# Patient Record
Sex: Female | Born: 1995 | Hispanic: Yes | Marital: Married | Smoking: Never smoker
Health system: Southern US, Community
[De-identification: ages and names within clinical notes are randomized; demographics above are authoritative.]

## PROBLEM LIST (undated history)

## (undated) DIAGNOSIS — O139 Gestational [pregnancy-induced] hypertension without significant proteinuria, unspecified trimester: Secondary | ICD-10-CM

## (undated) DIAGNOSIS — F32A Depression, unspecified: Secondary | ICD-10-CM

## (undated) HISTORY — PX: NO PAST SURGERIES: SHX2092

---

## 2020-04-11 ENCOUNTER — Ambulatory Visit: Payer: Self-pay | Admitting: Skilled Nursing Facility1

## 2021-07-11 LAB — OB RESULTS CONSOLE ABO/RH: RH Type: POSITIVE

## 2021-07-11 LAB — OB RESULTS CONSOLE HIV ANTIBODY (ROUTINE TESTING): HIV: NONREACTIVE

## 2021-07-11 LAB — HEPATITIS C ANTIBODY: HCV Ab: NEGATIVE

## 2021-07-11 LAB — OB RESULTS CONSOLE HEPATITIS B SURFACE ANTIGEN: Hepatitis B Surface Ag: NEGATIVE

## 2021-07-11 LAB — OB RESULTS CONSOLE RPR: RPR: NONREACTIVE

## 2021-07-11 LAB — OB RESULTS CONSOLE ANTIBODY SCREEN: Antibody Screen: NEGATIVE

## 2021-07-11 LAB — OB RESULTS CONSOLE RUBELLA ANTIBODY, IGM: Rubella: IMMUNE

## 2021-07-26 LAB — OB RESULTS CONSOLE GC/CHLAMYDIA
Chlamydia: NEGATIVE
Neisseria Gonorrhea: NEGATIVE

## 2021-12-16 ENCOUNTER — Encounter (HOSPITAL_COMMUNITY): Payer: Self-pay | Admitting: Obstetrics and Gynecology

## 2021-12-16 ENCOUNTER — Inpatient Hospital Stay (HOSPITAL_COMMUNITY)
Admission: AD | Admit: 2021-12-16 | Discharge: 2021-12-17 | Disposition: A | Discharge status: Home / Self Care | Payer: Self-pay | Attending: Obstetrics and Gynecology | Admitting: Obstetrics and Gynecology

## 2021-12-16 ENCOUNTER — Other Ambulatory Visit: Payer: Self-pay

## 2021-12-16 DIAGNOSIS — O4693 Antepartum hemorrhage, unspecified, third trimester: Secondary | ICD-10-CM

## 2021-12-16 DIAGNOSIS — R109 Unspecified abdominal pain: Secondary | ICD-10-CM | POA: Insufficient documentation

## 2021-12-16 DIAGNOSIS — O26853 Spotting complicating pregnancy, third trimester: Secondary | ICD-10-CM | POA: Insufficient documentation

## 2021-12-16 DIAGNOSIS — Z3A30 30 weeks gestation of pregnancy: Secondary | ICD-10-CM | POA: Insufficient documentation

## 2021-12-16 DIAGNOSIS — O26893 Other specified pregnancy related conditions, third trimester: Secondary | ICD-10-CM | POA: Insufficient documentation

## 2021-12-16 HISTORY — DX: Depression, unspecified: F32.A

## 2021-12-16 LAB — URINALYSIS, ROUTINE W REFLEX MICROSCOPIC
Bilirubin Urine: NEGATIVE
Glucose, UA: 50 mg/dL — AB
Ketones, ur: NEGATIVE mg/dL
Leukocytes,Ua: NEGATIVE
Nitrite: NEGATIVE
Protein, ur: NEGATIVE mg/dL
Specific Gravity, Urine: 1.009 (ref 1.005–1.030)
pH: 7 (ref 5.0–8.0)

## 2021-12-16 NOTE — MAU Note (Incomplete)
.  Joan Foster is a 26 y.o. at [redacted]w[redacted]d here in MAU reporting: spotting that started earlier today and noticed cramping the same time she noticed the spotting. Over the course of the day the spotting has gotten heavier and the cramping has worsened some. Reports positive fetal movement. Last intercourse was weeks ago. ?Onset of complaint: this am ?Pain score: 3/10 ?Vitals:  ? 12/16/21 2214  ?BP: 117/75  ?Pulse: (!) 113  ?Resp: 15  ?Temp: 97.8 ?F (36.6 ?C)  ?SpO2: 100%  ?   ?Lab orders placed from triage:  urine ? ?

## 2021-12-16 NOTE — MAU Provider Note (Signed)
Patient Joan Foster is a 26 y.o. G1P0 ? At [redacted]w[redacted]d here with complaints of light cramping and spotting today. She reports active fetal movements, she denies LOF. She reports that she is RH positive; she did not need Rhogam at 28 weeks.  ? ?She denies recent IC or anything in vagina in past 24 hours. She denies any syncope, chest pain, fever, decreased appetite, NV, contractions. She reports some small cramps.  ?History  ?  ? ?CSN: [id number] ? ?Arrival date and time: 12/16/21 2151 ? ? Event Date/Time  ? First Provider Initiated Contact with Patient 12/16/21 2350   ?  ? ?Chief Complaint  ?Patient presents with  ? Abdominal Pain  ? ?Abdominal Pain ?This is a new problem. The current episode started today. The onset quality is sudden. Pain scale: 0 at rest but 8 when walking. Pertinent negatives include no constipation, diarrhea, dysuria or fever.  ?Vaginal Bleeding ?The patient's primary symptoms include vaginal bleeding. This is a new problem. The current episode started today. The problem has been gradually worsening. Associated symptoms include abdominal pain. Pertinent negatives include no constipation, diarrhea, dysuria or fever. The vaginal discharge was bloody. The vaginal bleeding is lighter than menses. She has not been passing clots. She has not been passing tissue.  ?She reports that she had streaks on her pantyliners and changed them frequnely but all in all she probably only had one full panty-liner.  ?OB History   ? ? Gravida  ?1  ? Para  ?   ? Term  ?   ? Preterm  ?   ? AB  ?   ? Living  ?   ?  ? ? SAB  ?   ? IAB  ?   ? Ectopic  ?   ? Multiple  ?   ? Live Births  ?   ?   ?  ?  ? ? ?Past Medical History:  ?Diagnosis Date  ? Depression   ? ? ?Past Surgical History:  ?Procedure Laterality Date  ? NO PAST SURGERIES    ? ? ?History reviewed. No pertinent family history. ? ?Social History  ? ?Tobacco Use  ? Smoking status: Never  ? Smokeless tobacco: Never  ?Vaping Use  ? Vaping Use: Never used   ?Substance Use Topics  ? Alcohol use: Never  ? Drug use: Never  ? ? ?Allergies: Not on File ? ?Medications Prior to Admission  ?Medication Sig Dispense Refill Last Dose  ? buPROPion (WELLBUTRIN) 100 MG tablet Take 100 mg by mouth once.   12/15/2021  ? cholecalciferol (VITAMIN D3) 25 MCG (1000 UNIT) tablet Take 1,000 Units by mouth daily.   12/16/2021  ? Prenatal Vit-Fe Fumarate-FA (MULTIVITAMIN-PRENATAL) 27-0.8 MG TABS tablet Take 1 tablet by mouth daily at 12 noon.   12/16/2021  ? ? ?Review of Systems  ?Constitutional:  Negative for fever.  ?Gastrointestinal:  Positive for abdominal pain. Negative for constipation and diarrhea.  ?Genitourinary:  Positive for vaginal bleeding. Negative for dysuria.  ?Neurological: Negative.   ?Hematological: Negative.   ?Psychiatric/Behavioral: Negative.    ?Physical Exam  ? ?Blood pressure 117/75, pulse (!) 113, temperature 97.8 ?F (36.6 ?C), temperature source Oral, resp. rate 15, height 5\' 4"  (1.626 m), weight 80.7 kg, SpO2 100 %. ? ?Physical Exam ?Constitutional:   ?   Appearance: She is well-developed.  ?Abdominal:  ?   General: Abdomen is flat.  ?   Palpations: Abdomen is soft.  ?Genitourinary: ?   Vagina: Normal.  ?  Cervix: Normal.  ?   Comments: NEFG, trace amount of dark red blood in the vagina, blood mixed with mucous in the vaginal vault ?Neurological:  ?   Mental Status: She is alert.  ?Cervix is long, closed, posterior.  ? ?MAU Course  ?Procedures ? ?MDM ?-blood type not collected  ?-patient had limited US to assess placental location; no previa or abruption was identified ?-NST: 140 bpm, mod var, present acel, no decels, no contractions ?-Patient bleeding had resolved while in MAU ? ?Assessment and Plan  ? ?1. Vaginal bleeding in pregnancy, third trimester   ?2. [redacted] weeks gestation of pregnancy   ?-patient stable for discharge; she was given strict return precautions and bleeding precautions ?-confirmed again that patient states she is not negative blood type ?-patient and  partner verbalized understanding; all questions answered.  ?-explained to patient that if she has another episode of bleeding, she will most likely be admitted for 7 days for observation. Patient and husband understand this and will return if any new bleeding.  ?-keep follow up at [facility] ?Charlesetta Garibaldi Coreyon Nicotra ?12/16/2021, 11:57 PM  ?

## 2021-12-17 ENCOUNTER — Inpatient Hospital Stay (HOSPITAL_BASED_OUTPATIENT_CLINIC_OR_DEPARTMENT_OTHER): Payer: Self-pay

## 2021-12-17 DIAGNOSIS — O26893 Other specified pregnancy related conditions, third trimester: Secondary | ICD-10-CM

## 2021-12-17 DIAGNOSIS — O26853 Spotting complicating pregnancy, third trimester: Secondary | ICD-10-CM

## 2021-12-17 DIAGNOSIS — Z3A30 30 weeks gestation of pregnancy: Secondary | ICD-10-CM

## 2021-12-17 DIAGNOSIS — O4693 Antepartum hemorrhage, unspecified, third trimester: Secondary | ICD-10-CM

## 2021-12-17 LAB — WET PREP, GENITAL
Clue Cells Wet Prep HPF POC: NONE SEEN
Sperm: NONE SEEN
Trich, Wet Prep: NONE SEEN
WBC, Wet Prep HPF POC: >=10 — AB (ref <10)
Yeast Wet Prep HPF POC: NONE SEEN

## 2021-12-18 LAB — GC/CHLAMYDIA PROBE AMP (~~LOC~~) NOT AT ARMC
Chlamydia: NEGATIVE
Comment: NEGATIVE
Comment: NORMAL
Neisseria Gonorrhea: NEGATIVE

## 2022-02-01 ENCOUNTER — Telehealth (HOSPITAL_COMMUNITY): Payer: Self-pay | Admitting: *Deleted

## 2022-02-01 LAB — OB RESULTS CONSOLE GBS: GBS: POSITIVE

## 2022-02-01 NOTE — Telephone Encounter (Signed)
Preadmission screen  

## 2022-02-02 ENCOUNTER — Telehealth (HOSPITAL_COMMUNITY): Payer: Self-pay | Admitting: *Deleted

## 2022-02-05 ENCOUNTER — Telehealth (HOSPITAL_COMMUNITY): Payer: Self-pay | Admitting: *Deleted

## 2022-02-06 ENCOUNTER — Telehealth (HOSPITAL_COMMUNITY): Payer: Self-pay | Admitting: *Deleted

## 2022-02-07 ENCOUNTER — Telehealth (HOSPITAL_COMMUNITY): Payer: Self-pay | Admitting: *Deleted

## 2022-02-07 NOTE — Telephone Encounter (Signed)
Preadmission screen  

## 2022-02-08 ENCOUNTER — Telehealth (HOSPITAL_COMMUNITY): Payer: Self-pay | Admitting: *Deleted

## 2022-02-08 NOTE — Telephone Encounter (Signed)
Preadmission screen  

## 2022-02-21 ENCOUNTER — Inpatient Hospital Stay (HOSPITAL_COMMUNITY): Payer: Self-pay

## 2022-02-21 ENCOUNTER — Inpatient Hospital Stay (HOSPITAL_COMMUNITY): Payer: Self-pay | Admitting: Anesthesiology

## 2022-02-21 ENCOUNTER — Encounter (HOSPITAL_COMMUNITY): Payer: Self-pay | Admitting: Obstetrics & Gynecology

## 2022-02-21 ENCOUNTER — Inpatient Hospital Stay (HOSPITAL_COMMUNITY)
Admission: AD | Admit: 2022-02-21 | Discharge: 2022-02-24 | DRG: 788 | Disposition: A | Discharge status: Home / Self Care | Payer: Self-pay | Attending: Obstetrics & Gynecology | Admitting: Obstetrics & Gynecology

## 2022-02-21 DIAGNOSIS — Z3490 Encounter for supervision of normal pregnancy, unspecified, unspecified trimester: Principal | ICD-10-CM

## 2022-02-21 DIAGNOSIS — Z3A40 40 weeks gestation of pregnancy: Secondary | ICD-10-CM

## 2022-02-21 DIAGNOSIS — F32A Depression, unspecified: Secondary | ICD-10-CM | POA: Diagnosis present

## 2022-02-21 DIAGNOSIS — Z98891 History of uterine scar from previous surgery: Secondary | ICD-10-CM

## 2022-02-21 DIAGNOSIS — O99214 Obesity complicating childbirth: Secondary | ICD-10-CM | POA: Diagnosis present

## 2022-02-21 DIAGNOSIS — O3663X0 Maternal care for excessive fetal growth, third trimester, not applicable or unspecified: Principal | ICD-10-CM | POA: Diagnosis present

## 2022-02-21 DIAGNOSIS — O99344 Other mental disorders complicating childbirth: Secondary | ICD-10-CM | POA: Diagnosis present

## 2022-02-21 DIAGNOSIS — O99824 Streptococcus B carrier state complicating childbirth: Secondary | ICD-10-CM | POA: Diagnosis present

## 2022-02-21 LAB — CBC
HCT: 33.7 % — ABNORMAL LOW (ref 36.0–46.0)
Hemoglobin: 11.2 g/dL — ABNORMAL LOW (ref 12.0–15.0)
MCH: 27.8 pg (ref 26.0–34.0)
MCHC: 33.2 g/dL (ref 30.0–36.0)
MCV: 83.6 fL (ref 80.0–100.0)
Platelets: 284 10*3/uL (ref 150–400)
RBC: 4.03 MIL/uL (ref 3.87–5.11)
RDW: 13.3 % (ref 11.5–15.5)
WBC: 7.9 10*3/uL (ref 4.0–10.5)
nRBC: 0 % (ref 0.0–0.2)

## 2022-02-21 LAB — TYPE AND SCREEN
ABO/RH(D): O POS
Antibody Screen: NEGATIVE

## 2022-02-21 LAB — RPR: RPR Ser Ql: NONREACTIVE

## 2022-02-21 MED ORDER — FENTANYL-BUPIVACAINE-NACL 0.5-0.125-0.9 MG/250ML-% EP SOLN
12.0000 mL/h | EPIDURAL | Status: DC | PRN
  Administered 2022-02-21: 12 mL/h via EPIDURAL
  Filled 2022-02-21: qty 250

## 2022-02-21 MED ORDER — OXYTOCIN-SODIUM CHLORIDE 30-0.9 UT/500ML-% IV SOLN
1.0000 m[IU]/min | INTRAVENOUS | Status: DC
  Administered 2022-02-21: 2 m[IU]/min via INTRAVENOUS

## 2022-02-21 MED ORDER — LIDOCAINE HCL (PF) 1 % IJ SOLN: 30.0000 mL | INTRAMUSCULAR | Status: DC | PRN

## 2022-02-21 MED ORDER — SOD CITRATE-CITRIC ACID 500-334 MG/5ML PO SOLN
30.0000 mL | ORAL | Status: DC | PRN
  Administered 2022-02-22: 30 mL via ORAL
  Filled 2022-02-21: qty 30

## 2022-02-21 MED ORDER — LACTATED RINGERS IV SOLN: 500.0000 mL | INTRAVENOUS | Status: DC | PRN

## 2022-02-21 MED ORDER — TERBUTALINE SULFATE 1 MG/ML IJ SOLN: 0.2500 mg | Freq: Once | INTRAMUSCULAR | Status: DC | PRN

## 2022-02-21 MED ORDER — EPHEDRINE 5 MG/ML INJ: 10.0000 mg | INTRAVENOUS | Status: DC | PRN

## 2022-02-21 MED ORDER — ONDANSETRON HCL 4 MG/2ML IJ SOLN: 4.0000 mg | Freq: Four times a day (QID) | INTRAMUSCULAR | Status: DC | PRN

## 2022-02-21 MED ORDER — SODIUM CHLORIDE 0.9 % IV SOLN
5.0000 10*6.[IU] | Freq: Once | INTRAVENOUS | Status: AC
  Administered 2022-02-21: 5 10*6.[IU] via INTRAVENOUS
  Filled 2022-02-21: qty 5

## 2022-02-21 MED ORDER — OXYTOCIN BOLUS FROM INFUSION: 333.0000 mL | Freq: Once | INTRAVENOUS | Status: DC

## 2022-02-21 MED ORDER — OXYTOCIN-SODIUM CHLORIDE 30-0.9 UT/500ML-% IV SOLN
2.5000 [IU]/h | INTRAVENOUS | Status: DC
  Filled 2022-02-21: qty 500

## 2022-02-21 MED ORDER — LACTATED RINGERS IV SOLN: 500.0000 mL | Freq: Once | INTRAVENOUS | Status: DC

## 2022-02-21 MED ORDER — LIDOCAINE HCL (PF) 1 % IJ SOLN
INTRAMUSCULAR | Status: DC | PRN
  Administered 2022-02-21: 10 mL via EPIDURAL

## 2022-02-21 MED ORDER — MISOPROSTOL 25 MCG QUARTER TABLET
25.0000 ug | ORAL_TABLET | ORAL | Status: DC | PRN
Start: 2022-02-21 — End: 2022-02-22
  Administered 2022-02-21 (×2): 25 ug via VAGINAL
  Filled 2022-02-21 (×2): qty 1

## 2022-02-21 MED ORDER — OXYCODONE-ACETAMINOPHEN 5-325 MG PO TABS: 1.0000 | ORAL_TABLET | ORAL | Status: DC | PRN

## 2022-02-21 MED ORDER — PHENYLEPHRINE 80 MCG/ML (10ML) SYRINGE FOR IV PUSH (FOR BLOOD PRESSURE SUPPORT): 80.0000 ug | PREFILLED_SYRINGE | INTRAVENOUS | Status: DC | PRN

## 2022-02-21 MED ORDER — OXYCODONE-ACETAMINOPHEN 5-325 MG PO TABS: 2.0000 | ORAL_TABLET | ORAL | Status: DC | PRN

## 2022-02-21 MED ORDER — PENICILLIN G POT IN DEXTROSE 60000 UNIT/ML IV SOLN
3.0000 10*6.[IU] | INTRAVENOUS | Status: DC
  Administered 2022-02-21 (×5): 3 10*6.[IU] via INTRAVENOUS
  Filled 2022-02-21 (×5): qty 50

## 2022-02-21 MED ORDER — FENTANYL CITRATE (PF) 100 MCG/2ML IJ SOLN: 50.0000 ug | INTRAMUSCULAR | Status: DC | PRN

## 2022-02-21 MED ORDER — ZOLPIDEM TARTRATE 5 MG PO TABS
5.0000 mg | ORAL_TABLET | Freq: Every evening | ORAL | Status: DC | PRN
Start: 2022-02-21 — End: 2022-02-22

## 2022-02-21 MED ORDER — ACETAMINOPHEN 325 MG PO TABS
650.0000 mg | ORAL_TABLET | ORAL | Status: DC | PRN
  Administered 2022-02-21 (×2): 650 mg via ORAL
  Filled 2022-02-21 (×2): qty 2

## 2022-02-21 MED ORDER — DIPHENHYDRAMINE HCL 50 MG/ML IJ SOLN: 12.5000 mg | INTRAMUSCULAR | Status: DC | PRN

## 2022-02-21 MED ORDER — LACTATED RINGERS IV SOLN: INTRAVENOUS | Status: DC

## 2022-02-21 NOTE — Anesthesia Preprocedure Evaluation (Addendum)
Anesthesia Evaluation  Patient identified by MRN, date of birth, ID band Patient awake    Reviewed: Allergy & Precautions, Patient's Chart, lab work & pertinent test results  Airway Mallampati: II  TM Distance: >3 FB Neck ROM: Full    Dental no notable dental hx.    Pulmonary neg pulmonary ROS,    Pulmonary exam normal breath sounds clear to auscultation       Cardiovascular negative cardio ROS Normal cardiovascular exam Rhythm:Regular Rate:Normal     Neuro/Psych PSYCHIATRIC DISORDERS Depression negative neurological ROS     GI/Hepatic Neg liver ROS, GERD  ,  Endo/Other  Obesity  Renal/GU negative Renal ROS  negative genitourinary   Musculoskeletal negative musculoskeletal ROS (+)   Abdominal (+) + obese,   Peds  Hematology  (+) Blood dyscrasia, anemia ,   Anesthesia Other Findings   Reproductive/Obstetrics (+) Pregnancy                             Anesthesia Physical Anesthesia Plan  ASA: 2  Anesthesia Plan: Epidural   Post-op Pain Management:    Induction:   PONV Risk Score and Plan:   Airway Management Planned: Natural Airway  Additional Equipment:   Intra-op Plan:   Post-operative Plan:   Informed Consent: I have reviewed the patients History and Physical, chart, labs and discussed the procedure including the risks, benefits and alternatives for the proposed anesthesia with the patient or authorized representative who has indicated his/her understanding and acceptance.       Plan Discussed with: Anesthesiologist  Anesthesia Plan Comments:         Anesthesia Quick Evaluation

## 2022-02-21 NOTE — H&P (Signed)
Joan Foster is a 26 y.o. female G1 at [redacted]w[redacted]d presenting for eIOL at term.  Patient received second dose of cytotec at 0458.  She is feeling mild, infrequent CTX.  Active FM.  Antepartum course complicated by depression; taking Wellbutrin.  Patient has h/o LEEP.  GBS positive.   OB History     Gravida  1   Para      Term      Preterm      AB      Living         SAB      IAB      Ectopic      Multiple      Live Births             Past Medical History:  Diagnosis Date   Depression    Past Surgical History:  Procedure Laterality Date   NO PAST SURGERIES     Family History: family history is not on file. Social History:  reports that she has never smoked. She has never used smokeless tobacco. She reports that she does not drink alcohol and does not use drugs.     Maternal Diabetes: No Genetic Screening: Normal Maternal Ultrasounds/Referrals: Normal Fetal Ultrasounds or other Referrals:  None Maternal Substance Abuse:  No Significant Maternal Medications:  Meds include: Other: Wellbutrin Significant Maternal Lab Results:  Group B Strep positive Other Comments:  None  Review of Systems Maternal Medical History:  Fetal activity: Perceived fetal activity is normal.   Last perceived fetal movement was within the past hour.   Prenatal complications: no prenatal complications Prenatal Complications - Diabetes: none.   Dilation: 1.5 Effacement (%): 50 Station: Ballotable Exam by:: [employer]  RN Blood pressure 119/76, pulse 89, temperature 98 F (36.7 C), temperature source Oral, resp. rate 17, height 5\' 4"  (1.626 m), weight 88.4 kg. Maternal Exam:  Uterine Assessment: Contraction strength is mild.  Contraction frequency is irregular.  Abdomen: Patient reports no abdominal tenderness. Fundal height is c/w dates.   Estimated fetal weight is 8#.     Fetal Exam Fetal Monitor Review: Baseline rate: 130.  Variability: moderate (6-25 bpm).    Pattern: accelerations present and no decelerations.   Fetal State Assessment: Category I - tracings are normal.   Physical Exam Constitutional:      Appearance: Normal appearance.  HENT:     Head: Normocephalic and atraumatic.  Pulmonary:     Effort: Pulmonary effort is normal.  Abdominal:     Palpations: Abdomen is soft.  Musculoskeletal:        General: Normal range of motion.     Cervical back: Normal range of motion.  Skin:    General: Skin is warm and dry.  Neurological:     Mental Status: She is alert and oriented to person, place, and time.  Psychiatric:        Mood and Affect: Mood normal.        Behavior: Behavior normal.     Prenatal labs: ABO, Rh: --/--/O POS (07/12 0046) Antibody: NEG (07/12 0046) Rubella: Immune (11/29 0000) RPR: NON REACTIVE (07/12 0046)  HBsAg: Negative (11/29 0000)  HIV: Non-reactive (11/29 0000)  GBS: Positive/-- (06/22 0000)   Assessment/Plan: 26yo G1 at [redacted]w[redacted]d for eIOL -Recheck cvx around 0900 -CLEA if desired -PCN for GBS ppx -Anticipate NSVD   [redacted]w[redacted]d 02/21/2022, 8:24 AM

## 2022-02-21 NOTE — Progress Notes (Signed)
SVE 2-3/75/-2 Will start pitocin 2/2 AROM when in good CTX pattern FHT Cat I  Mitchel Honour, DO

## 2022-02-21 NOTE — Progress Notes (Signed)
Joan Foster is a 26 y.o. G1P0 at [redacted]w[redacted]d by ultrasound admitted for induction of labor due to Elective at term.  Subjective: Comfortable with CLEA.  Objective: BP 114/83   Pulse 96   Temp 98.2 F (36.8 C) (Oral)   Resp 16   Ht 5\' 4"  (1.626 m)   Wt 88.4 kg   SpO2 98%   BMI 33.45 kg/m  I/O last 3 completed shifts: In: -  Out: 250 [Urine:250] No intake/output data recorded.  FHT:  FHR: 140 bpm, variability: moderate,  accelerations:  Present,  decelerations:  Absent UC:   regular, every 2 minutes SVE:   Dilation: 5 Effacement (%): 90 Station: -2, -1 Exam by:: Dr. [email or web address] IUPC placed  Labs: Lab Results  Component Value Date   WBC 7.9 02/21/2022   HGB 11.2 (L) 02/21/2022   HCT 33.7 (L) 02/21/2022   MCV 83.6 02/21/2022   PLT 284 02/21/2022    Assessment / Plan: Induction of labor due to elective at term,  progressing well on pitocin  Labor:  IUPC placed to better monitor CTX and MVUs; continue pitocin Preeclampsia:   n/a Fetal Wellbeing:  Category I Pain Control:  Epidural I/D:  n/a Anticipated MOD:  NSVD  04/24/2022, DO 02/21/2022, 8:10 PM

## 2022-02-21 NOTE — Progress Notes (Signed)
Marnae Lorella Nimrod is a 26 y.o. G1P0 at [redacted]w[redacted]d by ultrasound admitted for induction of labor due to Elective at term.  Subjective: No c/o.  Comfortable with CLEA  Objective: BP 117/73   Pulse 79   Temp 97.7 F (36.5 C) (Oral)   Resp 17   Ht 5\' 4"  (1.626 m)   Wt 88.4 kg   SpO2 98%   BMI 33.45 kg/m  No intake/output data recorded. No intake/output data recorded.  FHT:  FHR: 130 bpm, variability: moderate,  accelerations:  Present,  decelerations:  Absent UC:   regular, every 2-3 minutes SVE:   4/75/-2  Labs: Lab Results  Component Value Date   WBC 7.9 02/21/2022   HGB 11.2 (L) 02/21/2022   HCT 33.7 (L) 02/21/2022   MCV 83.6 02/21/2022   PLT 284 02/21/2022    Assessment / Plan: Induction of labor due to elective at term,  progressing well on pitocin  Labor: Progressing normally Preeclampsia:   n/a; elevated BPs resolved after CLEA Fetal Wellbeing:  Category I Pain Control:  Epidural I/D:  n/a Anticipated MOD:  NSVD  04/24/2022, DO 02/21/2022, 4:39 PM

## 2022-02-21 NOTE — Anesthesia Procedure Notes (Addendum)
Epidural Patient location during procedure: OB Start time: 02/21/2022 2:55 PM End time: 02/21/2022 3:02 PM  Staffing Anesthesiologist: Mal Amabile, MD Performed: anesthesiologist   Preanesthetic Checklist Completed: patient identified, IV checked, site marked, risks and benefits discussed, surgical consent, monitors and equipment checked, pre-op evaluation and timeout performed  Epidural Patient position: sitting Prep: DuraPrep and site prepped and draped Patient monitoring: continuous pulse ox and blood pressure Approach: midline Location: L3-L4 Injection technique: LOR air  Needle:  Needle type: Tuohy  Needle gauge: 17 G Needle length: 9 cm and 9 Needle insertion depth: 4 cm Catheter type: closed end flexible Catheter size: 19 Gauge Catheter at skin depth: 9 cm Test dose: negative and Other  Assessment Events: blood not aspirated, injection not painful, no injection resistance, no paresthesia and negative IV test  Additional Notes Reason for block:procedure for pain

## 2022-02-21 NOTE — Progress Notes (Signed)
Joan Foster is a 26 y.o. G1P0 at 105w0d by ultrasound admitted for induction of labor due to Elective at term.  Subjective: Feeling mild CTX.  Reports HA.  No CP/SOB, RUQ pain, CP/SOB.  Objective: BP (!) 129/94   Pulse 92   Temp 97.9 F (36.6 C) (Oral)   Resp 17   Ht 5\' 4"  (1.626 m)   Wt 88.4 kg   BMI 33.45 kg/m  No intake/output data recorded. No intake/output data recorded.  FHT:  FHR: 140 bpm, variability: moderate,  accelerations:  Present,  decelerations:  Absent UC:   regular, every 2 minutes SVE:   Dilation: 2.5 Effacement (%): 70 Station: -2 Exam by:: Dr [email or web address] AROM, moderate meconium stained fluid  Labs: Lab Results  Component Value Date   WBC 7.9 02/21/2022   HGB 11.2 (L) 02/21/2022   HCT 33.7 (L) 02/21/2022   MCV 83.6 02/21/2022   PLT 284 02/21/2022    Assessment / Plan: Induction of labor due to elective at term,  progressing well on pitocin  Labor: Progressing normally Preeclampsia:   n/a.  Patient has had mild range BPs and now mild HA.  Tylenol given and will closely monitor. Fetal Wellbeing:  Category I Pain Control:  Labor support without medications I/D:  n/a Anticipated MOD:  NSVD  04/24/2022, DO 02/21/2022, 12:45 PM

## 2022-02-22 ENCOUNTER — Encounter (HOSPITAL_COMMUNITY): Payer: Self-pay | Admitting: Obstetrics & Gynecology

## 2022-02-22 ENCOUNTER — Encounter (HOSPITAL_COMMUNITY)
Admission: AD | Disposition: A | Discharge status: Home / Self Care | Payer: Self-pay | Source: Home / Self Care | Attending: Obstetrics & Gynecology

## 2022-02-22 DIAGNOSIS — Z3A40 40 weeks gestation of pregnancy: Secondary | ICD-10-CM

## 2022-02-22 DIAGNOSIS — Z98891 History of uterine scar from previous surgery: Secondary | ICD-10-CM

## 2022-02-22 LAB — CBC
HCT: 30.3 % — ABNORMAL LOW (ref 36.0–46.0)
Hemoglobin: 10 g/dL — ABNORMAL LOW (ref 12.0–15.0)
MCH: 27.8 pg (ref 26.0–34.0)
MCHC: 33 g/dL (ref 30.0–36.0)
MCV: 84.2 fL (ref 80.0–100.0)
Platelets: 222 10*3/uL (ref 150–400)
RBC: 3.6 MIL/uL — ABNORMAL LOW (ref 3.87–5.11)
RDW: 13.4 % (ref 11.5–15.5)
WBC: 14.7 10*3/uL — ABNORMAL HIGH (ref 4.0–10.5)
nRBC: 0 % (ref 0.0–0.2)

## 2022-02-22 SURGERY — Surgical Case
Anesthesia: Epidural

## 2022-02-22 MED ORDER — FERROUS SULFATE 325 (65 FE) MG PO TABS
325.0000 mg | ORAL_TABLET | Freq: Two times a day (BID) | ORAL | Status: DC
  Administered 2022-02-22 – 2022-02-24 (×5): 325 mg via ORAL
  Filled 2022-02-22 (×4): qty 1

## 2022-02-22 MED ORDER — PRENATAL MULTIVITAMIN CH
1.0000 | ORAL_TABLET | Freq: Every day | ORAL | Status: DC
  Administered 2022-02-23 – 2022-02-24 (×2): 1 via ORAL
  Filled 2022-02-22 (×2): qty 1

## 2022-02-22 MED ORDER — ONDANSETRON HCL 4 MG/2ML IJ SOLN: 4.0000 mg | Freq: Three times a day (TID) | INTRAMUSCULAR | Status: DC | PRN

## 2022-02-22 MED ORDER — FENTANYL CITRATE (PF) 100 MCG/2ML IJ SOLN
INTRAMUSCULAR | Status: AC
  Filled 2022-02-22: qty 2

## 2022-02-22 MED ORDER — IBUPROFEN 600 MG PO TABS
600.0000 mg | ORAL_TABLET | Freq: Four times a day (QID) | ORAL | Status: DC
  Administered 2022-02-22 (×2): 600 mg via ORAL
  Filled 2022-02-22 (×3): qty 1

## 2022-02-22 MED ORDER — KETOROLAC TROMETHAMINE 30 MG/ML IJ SOLN
30.0000 mg | Freq: Four times a day (QID) | INTRAMUSCULAR | Status: AC | PRN
  Administered 2022-02-22 (×2): 30 mg via INTRAVENOUS
  Filled 2022-02-22: qty 1

## 2022-02-22 MED ORDER — ACETAMINOPHEN 500 MG PO TABS
1000.0000 mg | ORAL_TABLET | Freq: Four times a day (QID) | ORAL | Status: DC
  Administered 2022-02-22 – 2022-02-24 (×7): 1000 mg via ORAL
  Filled 2022-02-22 (×10): qty 2

## 2022-02-22 MED ORDER — ZOLPIDEM TARTRATE 5 MG PO TABS: 5.0000 mg | ORAL_TABLET | Freq: Every evening | ORAL | Status: DC | PRN

## 2022-02-22 MED ORDER — PHENYLEPHRINE 80 MCG/ML (10ML) SYRINGE FOR IV PUSH (FOR BLOOD PRESSURE SUPPORT)
PREFILLED_SYRINGE | INTRAVENOUS | Status: DC | PRN
  Administered 2022-02-22 (×2): 160 ug via INTRAVENOUS
  Administered 2022-02-22: 240 ug via INTRAVENOUS

## 2022-02-22 MED ORDER — BUPROPION HCL ER (SR) 150 MG PO TB12
150.0000 mg | ORAL_TABLET | Freq: Two times a day (BID) | ORAL | Status: DC
  Administered 2022-02-22 – 2022-02-24 (×5): 150 mg via ORAL
  Filled 2022-02-22 (×7): qty 1

## 2022-02-22 MED ORDER — ACETAMINOPHEN 500 MG PO TABS
ORAL_TABLET | ORAL | Status: AC
  Filled 2022-02-22: qty 2

## 2022-02-22 MED ORDER — SENNOSIDES-DOCUSATE SODIUM 8.6-50 MG PO TABS
2.0000 | ORAL_TABLET | Freq: Every day | ORAL | Status: DC
Start: 2022-02-23 — End: 2022-02-24
  Administered 2022-02-23 – 2022-02-24 (×2): 2 via ORAL
  Filled 2022-02-22 (×2): qty 2

## 2022-02-22 MED ORDER — OXYCODONE HCL 5 MG PO TABS
5.0000 mg | ORAL_TABLET | ORAL | Status: DC | PRN
  Administered 2022-02-22 – 2022-02-24 (×7): 5 mg via ORAL
  Filled 2022-02-22 (×5): qty 1

## 2022-02-22 MED ORDER — NALOXONE HCL 0.4 MG/ML IJ SOLN: 0.4000 mg | INTRAMUSCULAR | Status: DC | PRN

## 2022-02-22 MED ORDER — KETOROLAC TROMETHAMINE 30 MG/ML IJ SOLN: 30.0000 mg | Freq: Four times a day (QID) | INTRAMUSCULAR | Status: AC | PRN

## 2022-02-22 MED ORDER — KETOROLAC TROMETHAMINE 30 MG/ML IJ SOLN: 30.0000 mg | Freq: Once | INTRAMUSCULAR | Status: DC | PRN

## 2022-02-22 MED ORDER — MEPERIDINE HCL 25 MG/ML IJ SOLN: 6.2500 mg | INTRAMUSCULAR | Status: DC | PRN

## 2022-02-22 MED ORDER — LACTATED RINGERS IV SOLN: INTRAVENOUS | Status: DC

## 2022-02-22 MED ORDER — DIPHENHYDRAMINE HCL 25 MG PO CAPS
25.0000 mg | ORAL_CAPSULE | Freq: Four times a day (QID) | ORAL | Status: DC | PRN
  Filled 2022-02-22: qty 1

## 2022-02-22 MED ORDER — MENTHOL 3 MG MT LOZG: 1.0000 | LOZENGE | OROMUCOSAL | Status: DC | PRN

## 2022-02-22 MED ORDER — SODIUM BICARBONATE 8.4 % IV SOLN
INTRAVENOUS | Status: DC | PRN
  Administered 2022-02-22: 4 mL via EPIDURAL
  Administered 2022-02-22: 10 mL via EPIDURAL
  Administered 2022-02-22: 4 mL via EPIDURAL
  Administered 2022-02-22: 2 mL via EPIDURAL

## 2022-02-22 MED ORDER — DIBUCAINE (PERIANAL) 1 % EX OINT: 1.0000 | TOPICAL_OINTMENT | CUTANEOUS | Status: DC | PRN

## 2022-02-22 MED ORDER — SODIUM CHLORIDE 0.9 % IR SOLN
Status: DC | PRN
  Administered 2022-02-22 (×3): 1

## 2022-02-22 MED ORDER — ONDANSETRON HCL 4 MG/2ML IJ SOLN: 4.0000 mg | Freq: Once | INTRAMUSCULAR | Status: DC | PRN

## 2022-02-22 MED ORDER — NALOXONE HCL 4 MG/10ML IJ SOLN: 1.0000 ug/kg/h | INTRAVENOUS | Status: DC | PRN

## 2022-02-22 MED ORDER — KETOROLAC TROMETHAMINE 30 MG/ML IJ SOLN
INTRAMUSCULAR | Status: AC
  Filled 2022-02-22: qty 1

## 2022-02-22 MED ORDER — CEFAZOLIN SODIUM-DEXTROSE 2-4 GM/100ML-% IV SOLN
2.0000 g | Freq: Once | INTRAVENOUS | Status: DC
Start: 2022-02-22 — End: 2022-02-22

## 2022-02-22 MED ORDER — OXYTOCIN-SODIUM CHLORIDE 30-0.9 UT/500ML-% IV SOLN
INTRAVENOUS | Status: DC | PRN
  Administered 2022-02-22: 30 [IU] via INTRAVENOUS

## 2022-02-22 MED ORDER — TETANUS-DIPHTH-ACELL PERTUSSIS 5-2.5-18.5 LF-MCG/0.5 IM SUSY: 0.5000 mL | PREFILLED_SYRINGE | Freq: Once | INTRAMUSCULAR | Status: DC

## 2022-02-22 MED ORDER — OXYTOCIN-SODIUM CHLORIDE 30-0.9 UT/500ML-% IV SOLN: 2.5000 [IU]/h | INTRAVENOUS | Status: AC

## 2022-02-22 MED ORDER — PHENYLEPHRINE 80 MCG/ML (10ML) SYRINGE FOR IV PUSH (FOR BLOOD PRESSURE SUPPORT)
PREFILLED_SYRINGE | INTRAVENOUS | Status: AC
  Filled 2022-02-22: qty 10

## 2022-02-22 MED ORDER — ACETAMINOPHEN 500 MG PO TABS
1000.0000 mg | ORAL_TABLET | Freq: Four times a day (QID) | ORAL | Status: AC
  Administered 2022-02-22 (×3): 1000 mg via ORAL
  Filled 2022-02-22: qty 2

## 2022-02-22 MED ORDER — SCOPOLAMINE 1 MG/3DAYS TD PT72
MEDICATED_PATCH | TRANSDERMAL | Status: AC
  Filled 2022-02-22: qty 1

## 2022-02-22 MED ORDER — FENTANYL CITRATE (PF) 100 MCG/2ML IJ SOLN
INTRAMUSCULAR | Status: DC | PRN
Start: 2022-02-22 — End: 2022-02-22
  Administered 2022-02-22: 100 ug via EPIDURAL

## 2022-02-22 MED ORDER — SIMETHICONE 80 MG PO CHEW: 80.0000 mg | CHEWABLE_TABLET | ORAL | Status: DC | PRN

## 2022-02-22 MED ORDER — OXYCODONE HCL 5 MG PO TABS
5.0000 mg | ORAL_TABLET | Freq: Four times a day (QID) | ORAL | Status: DC | PRN
  Filled 2022-02-22 (×2): qty 1

## 2022-02-22 MED ORDER — SODIUM CHLORIDE 0.9 % IV SOLN
500.0000 mg | Freq: Once | INTRAVENOUS | Status: AC
Start: 2022-02-22 — End: 2022-02-22
  Administered 2022-02-22: 500 mg via INTRAVENOUS

## 2022-02-22 MED ORDER — SIMETHICONE 80 MG PO CHEW
80.0000 mg | CHEWABLE_TABLET | Freq: Three times a day (TID) | ORAL | Status: DC
  Administered 2022-02-22 – 2022-02-24 (×7): 80 mg via ORAL
  Filled 2022-02-22 (×7): qty 1

## 2022-02-22 MED ORDER — DEXAMETHASONE SODIUM PHOSPHATE 10 MG/ML IJ SOLN
INTRAMUSCULAR | Status: DC | PRN
  Administered 2022-02-22: 10 mg via INTRAVENOUS

## 2022-02-22 MED ORDER — CEFAZOLIN SODIUM-DEXTROSE 2-3 GM-%(50ML) IV SOLR
INTRAVENOUS | Status: DC | PRN
  Administered 2022-02-22: 2 g via INTRAVENOUS

## 2022-02-22 MED ORDER — SODIUM CHLORIDE 0.9% FLUSH: 3.0000 mL | INTRAVENOUS | Status: DC | PRN

## 2022-02-22 MED ORDER — SODIUM CHLORIDE 0.9 % IV SOLN
INTRAVENOUS | Status: AC
  Filled 2022-02-22: qty 5

## 2022-02-22 MED ORDER — WITCH HAZEL-GLYCERIN EX PADS: 1.0000 | MEDICATED_PAD | CUTANEOUS | Status: DC | PRN

## 2022-02-22 MED ORDER — SCOPOLAMINE 1 MG/3DAYS TD PT72
1.0000 | MEDICATED_PATCH | Freq: Once | TRANSDERMAL | Status: DC
  Administered 2022-02-22: 1.5 mg via TRANSDERMAL

## 2022-02-22 MED ORDER — COCONUT OIL OIL: 1.0000 | TOPICAL_OIL | Status: DC | PRN

## 2022-02-22 MED ORDER — ONDANSETRON HCL 4 MG/2ML IJ SOLN
INTRAMUSCULAR | Status: DC | PRN
  Administered 2022-02-22: 4 mg via INTRAVENOUS

## 2022-02-22 MED ORDER — DIPHENHYDRAMINE HCL 25 MG PO CAPS
25.0000 mg | ORAL_CAPSULE | ORAL | Status: DC | PRN
  Administered 2022-02-22: 25 mg via ORAL

## 2022-02-22 MED ORDER — MORPHINE SULFATE (PF) 0.5 MG/ML IJ SOLN
INTRAMUSCULAR | Status: DC | PRN
  Administered 2022-02-22: 3 mg via EPIDURAL

## 2022-02-22 MED ORDER — DIPHENHYDRAMINE HCL 50 MG/ML IJ SOLN
12.5000 mg | INTRAMUSCULAR | Status: DC | PRN
  Administered 2022-02-22: 12.5 mg via INTRAVENOUS
  Filled 2022-02-22 (×2): qty 1

## 2022-02-22 MED ORDER — HYDROMORPHONE HCL 1 MG/ML IJ SOLN: 0.2500 mg | INTRAMUSCULAR | Status: DC | PRN

## 2022-02-22 MED ORDER — MORPHINE SULFATE (PF) 0.5 MG/ML IJ SOLN
INTRAMUSCULAR | Status: AC
  Filled 2022-02-22: qty 10

## 2022-02-22 SURGICAL SUPPLY — 33 items
BENZOIN TINCTURE PRP APPL 2/3 (GAUZE/BANDAGES/DRESSINGS) ×2 IMPLANT
CHLORAPREP W/TINT 26ML (MISCELLANEOUS) ×4 IMPLANT
CLAMP CORD UMBIL (MISCELLANEOUS) ×2 IMPLANT
CLOSURE STERI STRIP 1/2 X4 (GAUZE/BANDAGES/DRESSINGS) ×1 IMPLANT
CLOTH BEACON ORANGE TIMEOUT ST (SAFETY) ×2 IMPLANT
DERMABOND ADVANCED (GAUZE/BANDAGES/DRESSINGS)
DERMABOND ADVANCED .7 DNX12 (GAUZE/BANDAGES/DRESSINGS) IMPLANT
DRSG OPSITE POSTOP 4X10 (GAUZE/BANDAGES/DRESSINGS) ×2 IMPLANT
ELECT REM PT RETURN 9FT ADLT (ELECTROSURGICAL) ×2
ELECTRODE REM PT RTRN 9FT ADLT (ELECTROSURGICAL) ×1 IMPLANT
EXTRACTOR VACUUM KIWI (MISCELLANEOUS) IMPLANT
GLOVE BIO SURGEON STRL SZ 6 (GLOVE) ×2 IMPLANT
GLOVE BIOGEL PI IND STRL 6 (GLOVE) ×2 IMPLANT
GLOVE BIOGEL PI IND STRL 7.0 (GLOVE) ×1 IMPLANT
GLOVE BIOGEL PI INDICATOR 6 (GLOVE) ×2
GLOVE BIOGEL PI INDICATOR 7.0 (GLOVE) ×1
GOWN STRL REUS W/TWL LRG LVL3 (GOWN DISPOSABLE) ×4 IMPLANT
KIT ABG SYR 3ML LUER SLIP (SYRINGE) ×2 IMPLANT
NDL HYPO 25X5/8 SAFETYGLIDE (NEEDLE) ×1 IMPLANT
NEEDLE HYPO 25X5/8 SAFETYGLIDE (NEEDLE) ×2 IMPLANT
NS IRRIG 1000ML POUR BTL (IV SOLUTION) ×2 IMPLANT
PACK C SECTION WH (CUSTOM PROCEDURE TRAY) ×2 IMPLANT
PAD OB MATERNITY 4.3X12.25 (PERSONAL CARE ITEMS) ×2 IMPLANT
STRIP CLOSURE SKIN 1/2X4 (GAUZE/BANDAGES/DRESSINGS) IMPLANT
SUT CHROMIC 0 CTX 36 (SUTURE) ×6 IMPLANT
SUT MON AB 2-0 CT1 27 (SUTURE) ×2 IMPLANT
SUT PDS AB 0 CT1 27 (SUTURE) IMPLANT
SUT PLAIN 0 NONE (SUTURE) IMPLANT
SUT VIC AB 0 CT1 36 (SUTURE) IMPLANT
SUT VIC AB 4-0 KS 27 (SUTURE) IMPLANT
TOWEL OR 17X24 6PK STRL BLUE (TOWEL DISPOSABLE) ×2 IMPLANT
TRAY FOLEY W/BAG SLVR 14FR LF (SET/KITS/TRAYS/PACK) IMPLANT
WATER STERILE IRR 1000ML POUR (IV SOLUTION) ×3 IMPLANT

## 2022-02-22 NOTE — Transfer of Care (Signed)
Immediate Anesthesia Transfer of Care Note  Patient: Joan Foster  Procedure(s) Performed: CESAREAN SECTION  Patient Location: PACU  Anesthesia Type:Epidural  Level of Consciousness: awake, alert  and patient cooperative  Airway & Oxygen Therapy: Patient Spontanous Breathing  Post-op Assessment: Report given to RN and Post -op Vital signs reviewed and stable  Post vital signs: Reviewed and stable  Last Vitals:  Vitals Value Taken Time  BP 114/72 02/22/22 0320  Temp    Pulse 117 02/22/22 0324  Resp 17 02/22/22 0324  SpO2 99 % 02/22/22 0324  Vitals shown include unvalidated device data.  Last Pain:  Vitals:   02/22/22 0031  TempSrc: Oral  PainSc:          Complications: No notable events documented.

## 2022-02-22 NOTE — Progress Notes (Signed)
Joan Foster is a 26 y.o. G1P0 at [redacted]w[redacted]d by ultrasound admitted for induction of labor due to Elective at term.  Subjective: Comfortable with CLEA.  Objective: BP 125/77   Pulse 93   Temp 98.7 F (37.1 C) (Oral)   Resp 17   Ht 5\' 4"  (1.626 m)   Wt 88.4 kg   SpO2 100%   BMI 33.45 kg/m  I/O last 3 completed shifts: In: -  Out: 250 [Urine:250] Total I/O In: 0  Out: 275 [Urine:275]  FHT:  FHR: 140 bpm, variability: moderate,  accelerations:  Present,  decelerations:  Absent UC:   regular, every 2-3 minutes SVE:   Dilation: 5.5 Effacement (%): 90 Station: 0, Plus 1 Exam by:: Dr. [email or web address]  Labs: Lab Results  Component Value Date   WBC 7.9 02/21/2022   HGB 11.2 (L) 02/21/2022   HCT 33.7 (L) 02/21/2022   MCV 83.6 02/21/2022   PLT 284 02/21/2022    Assessment / Plan: Arrest of dilation  Labor:  Patient informed of no cervical change since my last check at 2000.  She is on pitocin 28 mU and has been frequently repositioned.  I recommend C/S at this time for arrest of dilation and patient accepts. Preeclampsia:   n/a Fetal Wellbeing:  Category I Pain Control:  Epidural I/D:  n/a Anticipated MOD:   C/S   Patient is counseled re: risk of bleeding, infection, scarring and damage to surrounding structures.  She is informed of implications in future pregnancies including abnormal placentation and uterine rupture.  She is counseled re: steps of the procedure as well as postop expectations.  All questions were answered and we will proceed.  04/24/2022, DO 02/22/2022, 1:23 AM

## 2022-02-22 NOTE — Lactation Note (Signed)
This note was copied from a baby's chart. Lactation Consultation Note Mom doesn't want to be seen by Lactation until am. Mom wants to rest she is tired. Ask for formula to be given.   Patient Name: Joan Foster [id number] Date: 02/22/2022   Age:27 hours  Maternal Data    Feeding    LATCH Score                    Lactation Tools Discussed/Used    Interventions    Discharge    Consult Status      Charyl Dancer 02/22/2022, 5:29 AM

## 2022-02-22 NOTE — Op Note (Signed)
Horton Finer PROCEDURE DATE: 02/22/2022  PREOPERATIVE DIAGNOSIS: Intrauterine pregnancy at  [redacted]w[redacted]d weeks gestation, arrest of dilation  POSTOPERATIVE DIAGNOSIS: The same  PROCEDURE: Primary Low Transverse Cesarean Section  SURGEON:  Dr. Mitchel Honour  INDICATIONS: Joan Foster is a 26 y.o. G1P0 at [redacted]w[redacted]d scheduled for cesarean section secondary to arrest of dilation at 5 cm.  The risks of cesarean section discussed with the patient included but were not limited to: bleeding which may require transfusion or reoperation; infection which may require antibiotics; injury to bowel, bladder, ureters or other surrounding organs; injury to the fetus; need for additional procedures including hysterectomy in the event of a life-threatening hemorrhage; placental abnormalities wth subsequent pregnancies, incisional problems, thromboembolic phenomenon and other postoperative/anesthesia complications. The patient concurred with the proposed plan, giving informed written consent for the procedure.    FINDINGS:  Viable female infant in cephalic presentation, APGARs 8,9: weight 10#2  Meconium stained amniotic fluid.  Intact placenta, three vessel cord.  Grossly normal uterus, ovaries and fallopian tubes. .   ANESTHESIA:    Epidural ESTIMATED BLOOD LOSS: 800 ml SPECIMENS: Placenta sent to L&D COMPLICATIONS: None immediate  PROCEDURE IN DETAIL:  The patient received intravenous antibiotics and had sequential compression devices applied to her lower extremities while in the preoperative area.  She was then taken to the operating room where epidural anesthesia was dosed up to surgical level and was found to be adequate. She was then placed in a dorsal supine position with a leftward tilt, and prepped and draped in a sterile manner.  A foley catheter was placed into her bladder and attached to constant gravity.  After an adequate timeout was performed, a Pfannenstiel skin incision was made with scalpel  and carried through to the underlying layer of fascia. The fascia was incised in the midline and this incision was extended bilaterally using the Mayo scissors. Kocher clamps were applied to the superior aspect of the fascial incision and the underlying rectus muscles were dissected off bluntly. A similar process was carried out on the inferior aspect of the facial incision. The rectus muscles were separated in the midline bluntly and the peritoneum was entered bluntly.  Bladder flap was created sharply and developed bluntly.  Bladder blade was placed.  A transverse hysterotomy was made with a scalpel and extended bilaterally bluntly. The bladder blade was then removed. The infant was successfully delivered, and cord was clamped and cut and infant was handed over to awaiting neonatology team. Uterine massage was then administered and the placenta delivered intact with three-vessel cord. The uterus was cleared of clot and debris.  The hysterotomy was closed with 0 chromic.  A second imbricating suture of 0-chromic was used to reinforce the incision and aid in hemostasis.  The peritoneum and rectus muscles were noted to be hemostatic and were reapproximated using 2-0 monocryl in a running fashion.  The fascia was closed with 0-Vicryl in a running fashion with good restoration of anatomy.  The subcutaneus tissue was copiously irrigated.  The skin was closed with 4-0 vicryl in a subcuticular fashion.  Pt tolerated the procedure will.  All counts were correct x2.  Pt went to the recovery room in stable condition.

## 2022-02-22 NOTE — Lactation Note (Signed)
This note was copied from a baby's chart. Lactation Consultation Note  Patient Name: Joan Foster [id number] Date: 02/22/2022 Reason for consult: Initial assessment Age:27 hours  P1, Mother states they would like to breastfeed and formula feed.  Suggest offering breast before formula to help establish mother's milk supply. Baby recently had bottle of formula. Recommend calling for help as needed. Provided lactation information sheet. Feed on demand with cues.  Goal 8-12+ times per day after first 24 hrs.  Place baby STS if not cueing.    Maternal Data Does the patient have breastfeeding experience prior to this delivery?: No  Feeding Mother's Current Feeding Choice: Breast Milk and Formula Nipple Type: Slow - flow   Interventions Interventions: Education;LC Services brochure  Discharge    Consult Status Consult Status: Follow-up Date: 02/23/22 Follow-up type: In-patient    Dahlia Byes [facility] 02/22/2022, 8:25 AM

## 2022-02-22 NOTE — Progress Notes (Signed)
POD # 0  Doing well. BP 124/83 (BP Location: Left Arm)   Pulse 83   Temp 98.5 F (36.9 C) (Oral)   Resp 18   Ht 5\' 4"  (1.626 m)   Wt 88.4 kg   SpO2 97%   Breastfeeding Unknown   BMI 33.45 kg/m  Results for orders placed or performed during the hospital encounter of 02/21/22 (from the past 24 hour(s))  CBC     Status: Abnormal   Collection Time: 02/22/22  6:41 AM  Result Value Ref Range   WBC 14.7 (H) 4.0 - 10.5 K/uL   RBC 3.60 (L) 3.87 - 5.11 MIL/uL   Hemoglobin 10.0 (L) 12.0 - 15.0 g/dL   HCT 02/24/22 (L) 69.4 - 85.4 %   MCV 84.2 80.0 - 100.0 fL   MCH 27.8 26.0 - 34.0 pg   MCHC 33.0 30.0 - 36.0 g/dL   RDW 62.7 03.5 - 00.9 %   Platelets 222 150 - 400 K/uL   nRBC 0.0 0.0 - 0.2 %   Abdomen is soft and non tender Pressure dressing removed - dry Honeycomb is saturated on the right only  No new or active bleeding Uterus is firm  POD # 0  Doing well Routine care

## 2022-02-23 MED ORDER — IBUPROFEN 600 MG PO TABS
600.0000 mg | ORAL_TABLET | Freq: Four times a day (QID) | ORAL | Status: DC
  Administered 2022-02-23 – 2022-02-24 (×5): 600 mg via ORAL
  Filled 2022-02-23 (×5): qty 1

## 2022-02-23 NOTE — Social Work (Signed)
CSW received consult for hx of depression and Edinburgh 12.  CSW met with MOB to offer support and complete assessment.    CSW met with MOB at bedside and introduced CSW role. CSW observed MOB up in the room, FOB present at bedside and additional supports at bedside. CSW offered to return later. MOB welcomed CSW to complete the assessment with FOB and family present. CSW inquired how MOB has felt since giving birth. MOB reported "this is the best I have felt in nine months" and shared that she had a good pregnancy. MOB expressed that she did feel emotionally "up and down." CSW discussed the Edinburgh. MOB reported that she completed the Edinburgh prior to the seven days because the last seven days she has felt good. MOB acknowledged that she has a history of depression diagnosed in 2021. MOB reported that she treats her symptoms with Wellbutrin and feels the medication is very helpful. MOB reported that she will continue to take the medication postpartum and has discussed increasing the dosage if needed. MOB reported that she feels comfortable talking with her husband and reaching out to the doctor if she has concerns. CSW discussed PPD. CSW assessed MOB for safety. MOB denied SI/HI/DV.   CSW provided education regarding the baby blues period vs. perinatal mood disorders, discussed treatment and gave resources for mental health follow up if concerns arise.  CSW recommends self-evaluation during the postpartum time period using the New Mom Checklist from Postpartum Progress and encouraged MOB to contact a medical professional if symptoms are noted at any time.    MOB reported she has all items for the infant including a bassinet where the infant will sleep. CSW provided review of Sudden Infant Death Syndrome (SIDS) precautions. MOB has chosen [facility] for the infant's follow up care.   CSW identifies no further need for intervention and no barriers to discharge at this time.   Nicole Sinclair, MSW,  LCSW [facility]  Clinical Social Worker  [phone] 02/23/2022  12:39 PM  

## 2022-02-23 NOTE — Progress Notes (Signed)
Subjective: Postpartum Day 1: Cesarean Delivery Patient reports incisional pain, tolerating PO, and no problems voiding.    Objective: Vital signs in last 24 hours: Temp:  [98 F (36.7 C)-98.5 F (36.9 C)] 98 F (36.7 C) (07/14 0500) Pulse Rate:  [80-91] 85 (07/14 0500) Resp:  [16-18] 18 (07/14 0500) BP: (119-131)/(71-90) 119/87 (07/14 0500) SpO2:  [96 %-100 %] 100 % (07/14 0500)  Physical Exam:  General: alert, cooperative, appears stated age, and no distress Lochia: appropriate Uterine Fundus: firm Incision: healing well DVT Evaluation: No evidence of DVT seen on physical exam.  Recent Labs    02/21/22 0046 02/22/22 0641  HGB 11.2* 10.0*  HCT 33.7* 30.3*    Assessment/Plan: Status post Cesarean section. Doing well postoperatively.  Continue current care Does NOT want circumcision on baby.  Turner Daniels, MD 02/23/2022, 9:12 AM

## 2022-02-24 MED ORDER — OXYCODONE HCL 5 MG PO TABS: 5.0000 mg | ORAL_TABLET | Freq: Four times a day (QID) | ORAL | 0 refills | Status: DC | PRN

## 2022-02-24 MED ORDER — IBUPROFEN 600 MG PO TABS: 600.0000 mg | ORAL_TABLET | Freq: Four times a day (QID) | ORAL | 0 refills | Status: DC | PRN

## 2022-02-24 NOTE — Discharge Summary (Signed)
Postpartum Discharge Summary       Patient Name: Joan Foster DOB: 02/15/1996 MRN: [id number]  Date of admission: 02/21/2022 Delivery date:02/22/2022  Delivering provider: Linda Hedges  Date of discharge: 02/24/2022  Admitting diagnosis: Pregnancy [Z34.90] S/P cesarean section [Z98.891] Intrauterine pregnancy: [redacted]w[redacted]d    Secondary diagnosis:  Principal Problem:   Pregnancy Active Problems:   S/P cesarean section  Additional problems:      Discharge diagnosis: Term Pregnancy Delivered                                              Post partum procedures:   Augmentation: AROM, Pitocin, and Cytotec Complications: None  Hospital course: Induction of Labor With Cesarean Section   26y.o. yo G1P1001 at 473w1das admitted to the hospital 02/21/2022 for induction of labor. Patient had a labor course significant for arrest of dilation. The patient went for cesarean section due to Macrosomia and Arrest of Dilation. Delivery details are as follows: Membrane Rupture Time/Date: 12:40 PM ,02/21/2022   Delivery Method:C-Section, Low Transverse  Details of operation can be found in separate operative Note.  Patient had an uncomplicated postpartum course. She is ambulating, tolerating a regular diet, passing flatus, and urinating well.  Patient is discharged home in stable condition on 02/24/22.      Newborn Data: Birth date:02/22/2022  Birth time:2:27 AM  Gender:Female  Living status:Living  Apgars:8 ,9  [id number]                                Magnesium Sulfate received: No BMZ received: No Rhophylac:N/A MMR:Yes T-DaP:Given prenatally Flu: N/A Transfusion:No  Physical exam  Vitals:   02/22/22 1639 02/22/22 2100 02/22/22 2300 02/23/22 0500  BP: 128/77 126/71 122/88 119/87  Pulse: 87 90 91 85  Resp: _0 Temp: 98.3 F (36.8 C) 98.5 F (36.9 C) 98.4 F (36.9 C) 98 F (36.7 C)  TempSrc: Oral Oral Oral Oral  SpO2: 100% 100%  100%  Weight:      Height:        General: alert, cooperative, and no distress Lochia: appropriate Uterine Fundus: firm Incision: Healing well with no significant drainage DVT Evaluation: No evidence of DVT seen on physical exam. Labs: Lab Results  Component Value Date   WBC 14.7 (H) 02/22/2022   HGB 10.0 (L) 02/22/2022   HCT 30.3 (L) 02/22/2022   MCV 84.2 02/22/2022   PLT 222 02/22/2022       No data to display         Edinburgh Score:    02/22/2022    4:39 PM  Edinburgh Postnatal Depression Scale Screening Tool  I have been able to laugh and see the funny side of things. 0  I have looked forward with enjoyment to things. 0  I have blamed myself unnecessarily when things went wrong. 2  I have been anxious or worried for no good reason. 2  I have felt scared or panicky for no good reason. 1  Things have been getting on top of me. 1  I have been so unhappy that I have had difficulty sleeping. 3  I have felt sad or miserable. 2  I have been so unhappy that I have been crying. 1  The thought of harming  myself has occurred to me. 0  Edinburgh Postnatal Depression Scale Total 12      After visit meds:     Discharge home in stable condition Infant Feeding: Breast Infant Disposition:home with mother Discharge instruction: per After Visit Summary and Postpartum booklet. Activity: Advance as tolerated. Pelvic rest for 6 weeks.  Diet: routine diet Anticipated Birth Control: Unsure Postpartum Appointment:6 weeks Additional Postpartum F/U:    Future Appointments:No future appointments. Follow up Visit:      02/24/2022 David C Lowe, MD   

## 2022-02-27 ENCOUNTER — Telehealth (HOSPITAL_COMMUNITY): Payer: Self-pay | Admitting: *Deleted

## 2022-02-27 NOTE — Telephone Encounter (Signed)
Opened in error.  Duffy Rhody, RN 02-27-2022 at 11:33am

## 2022-03-03 ENCOUNTER — Telehealth (HOSPITAL_COMMUNITY): Payer: Self-pay

## 2022-03-03 NOTE — Telephone Encounter (Signed)
Patient did not answer phone call. Voicemail left for patient.   Joan Foster [facility] 03/03/22,1208

## 2022-03-06 ENCOUNTER — Encounter (HOSPITAL_COMMUNITY): Payer: Self-pay | Admitting: Obstetrics & Gynecology

## 2022-03-10 NOTE — Addendum Note (Signed)
Addendum  created 03/10/22 1928 by Leilani Able, MD   Clinical Note Signed

## 2022-03-10 NOTE — Anesthesia Postprocedure Evaluation (Signed)
Anesthesia Post Note  Patient: Joan Foster  Procedure(s) Performed: CESAREAN SECTION     Patient location during evaluation: PACU Anesthesia Type: Epidural Level of consciousness: awake Pain management: pain level controlled Vital Signs Assessment: post-procedure vital signs reviewed and stable Respiratory status: spontaneous breathing Cardiovascular status: stable Postop Assessment: no headache, no backache, no apparent nausea or vomiting and patient able to bend at knees Anesthetic complications: no   No notable events documented.  Last Vitals:  Vitals:   02/22/22 2300 02/23/22 0500  BP: 122/88 119/87  Pulse: 91 85  Resp: 18 18  Temp: 36.9 C 36.7 C  SpO2:  100%    Last Pain:  Vitals:   02/24/22 0903  TempSrc:   PainSc: 0-No pain   Pain Goal:                   Caren Macadam

## 2022-03-10 NOTE — Addendum Note (Signed)
Addendum  created 03/10/22 1925 by Leilani Able, MD   Clinical Note Signed, Intraprocedure Blocks edited

## 2022-03-10 NOTE — Anesthesia Postprocedure Evaluation (Signed)
Anesthesia Post Note  Patient: Joan Foster  Procedure(s) Performed: CESAREAN SECTION     Patient location during evaluation: PACU Anesthesia Type: Epidural Level of consciousness: awake Pain management: pain level controlled Vital Signs Assessment: post-procedure vital signs reviewed and stable Respiratory status: spontaneous breathing Cardiovascular status: stable Postop Assessment: no headache, no backache, epidural receding, patient able to bend at knees and no apparent nausea or vomiting Anesthetic complications: no   No notable events documented.  Last Vitals:  Vitals:   02/22/22 2300 02/23/22 0500  BP: 122/88 119/87  Pulse: 91 85  Resp: 18 18  Temp: 36.9 C 36.7 C  SpO2:  100%    Last Pain:  Vitals:   02/24/22 0903  TempSrc:   PainSc: 0-No pain   Pain Goal:                   Caren Macadam

## 2023-08-04 IMAGING — US US MFM OB LIMITED
1 series · 15 of 17 positions shown · non-contrast
Comparison: none

[Series 1: us mfm ob limited · 17 acquisitions, 15 frames shown]
[im 1/17]
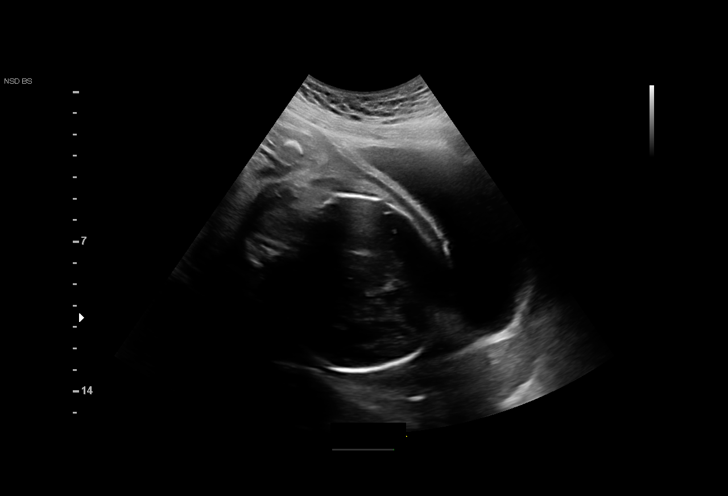
[im 2/17]
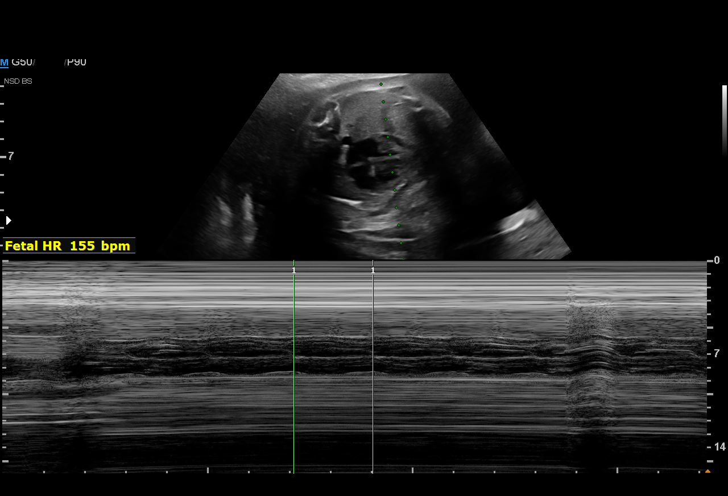
[im 3/17]
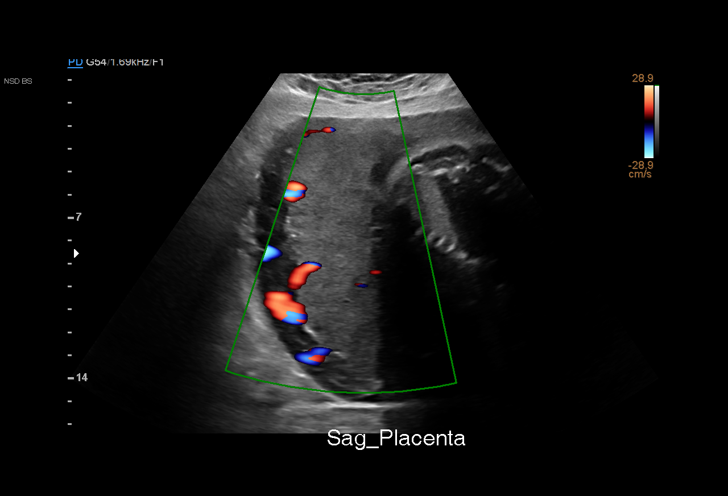
[im 4/17]
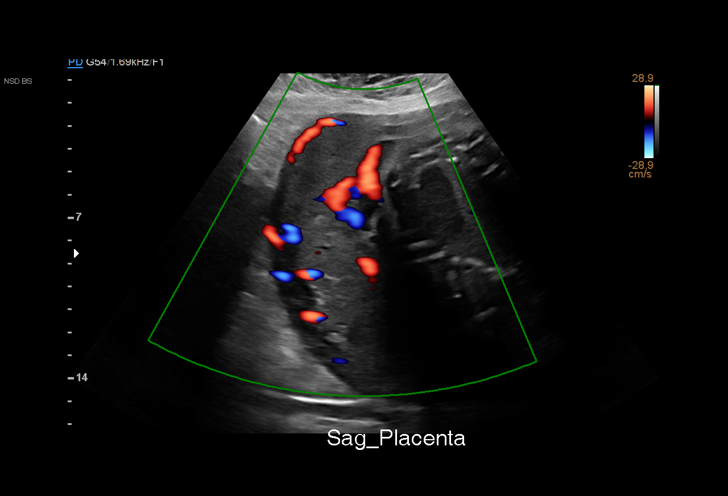
[im 6/17]
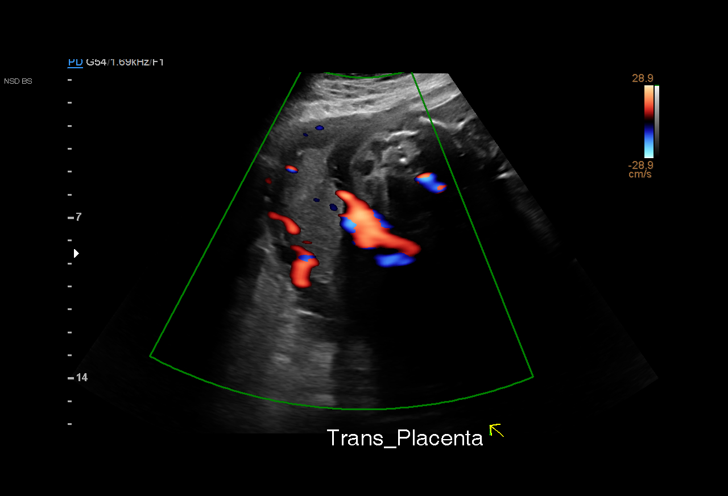
[im 7/17]
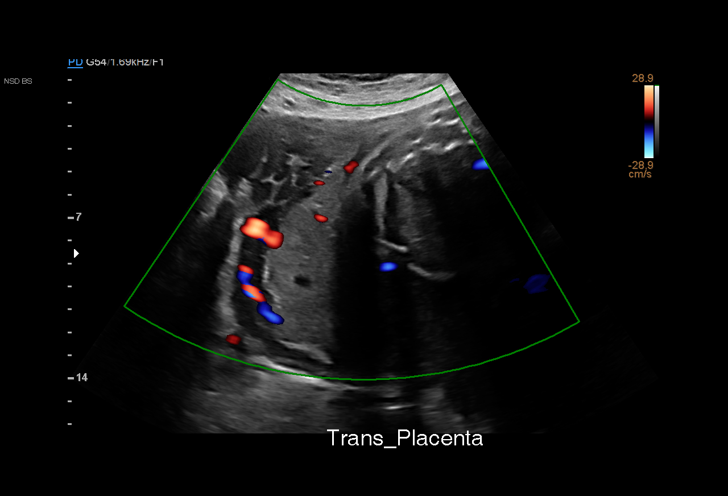
[im 8/17]
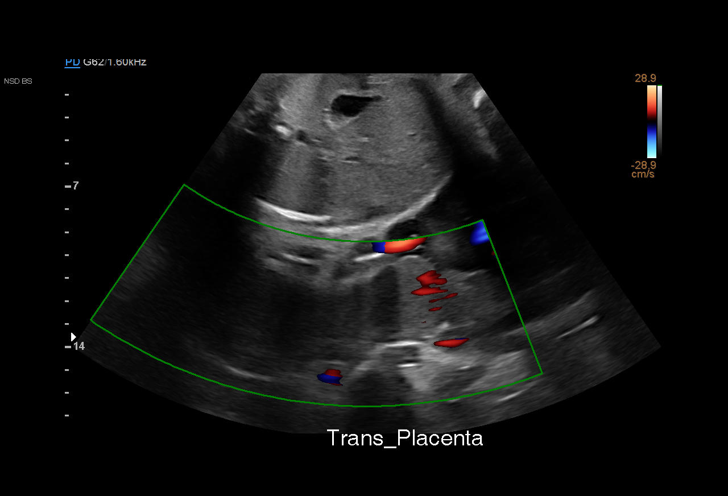
[im 9/17]
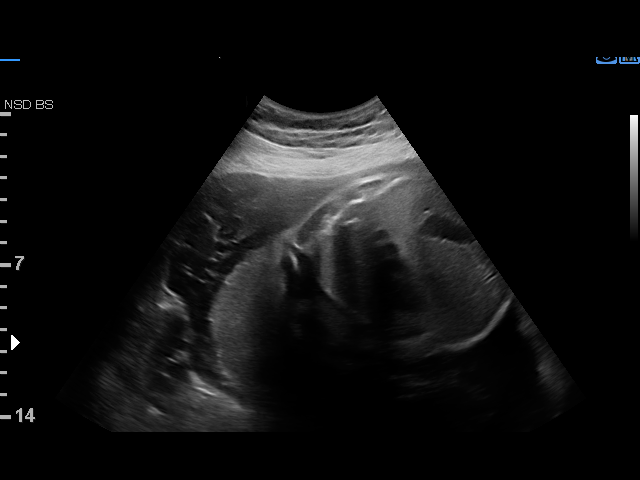
[im 10/17]
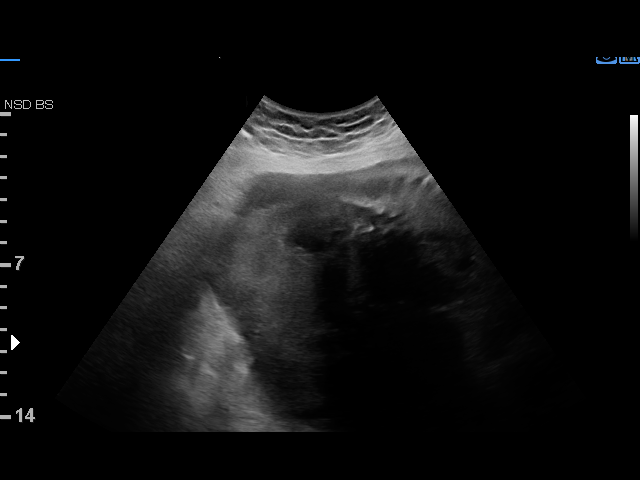
[im 11/17]
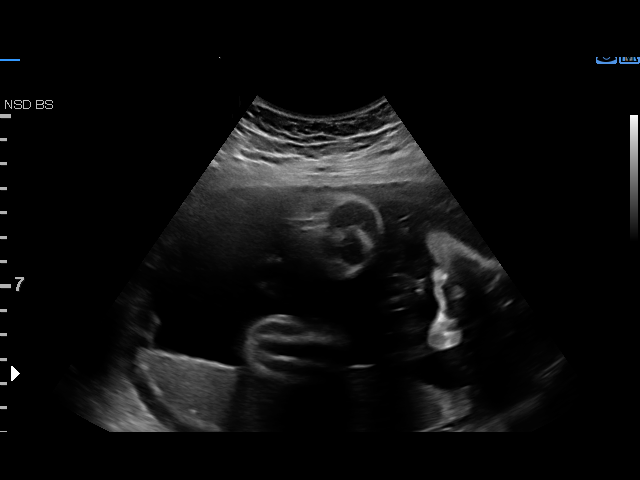
[im 12/17]
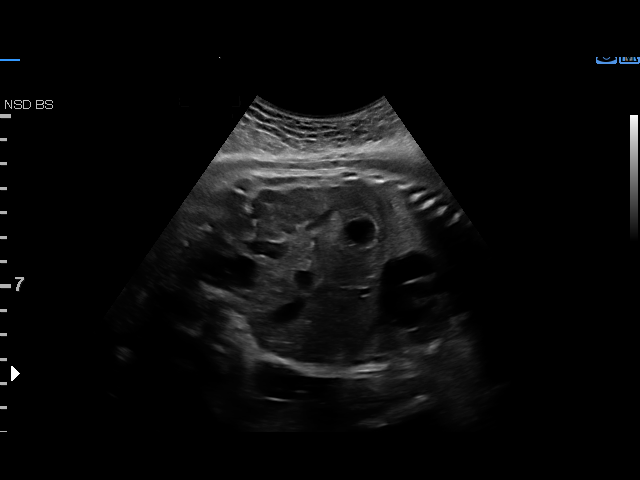
[im 14/17]
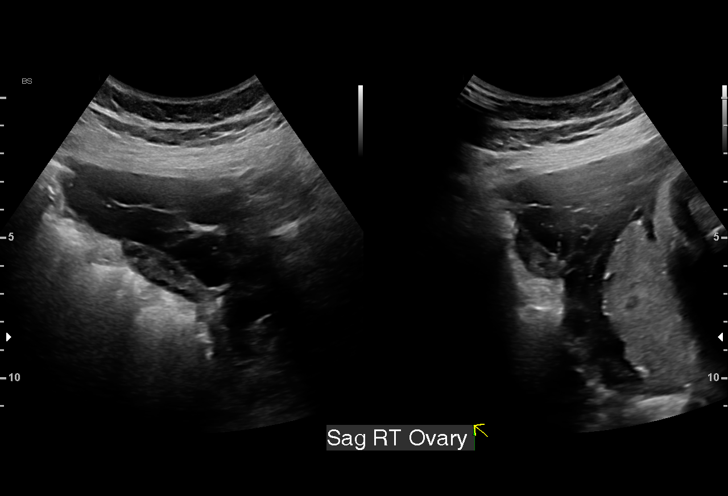
[im 15/17]
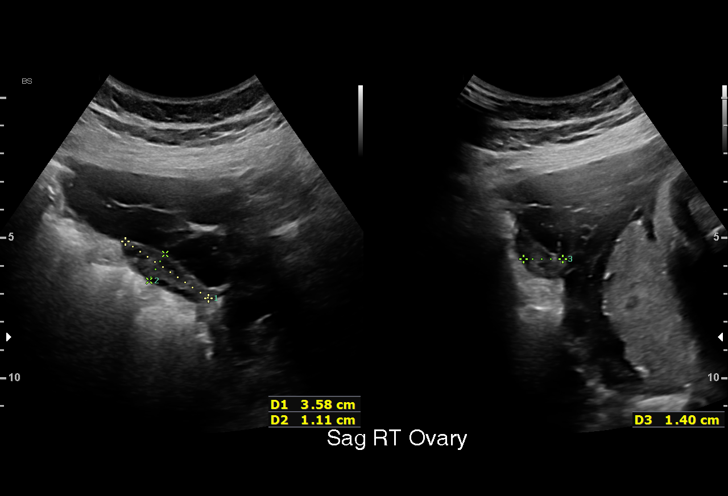
[im 16/17]
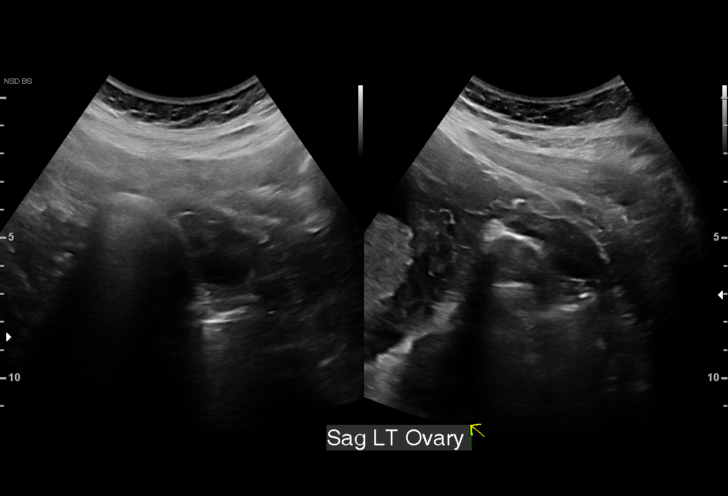
[im 17/17]
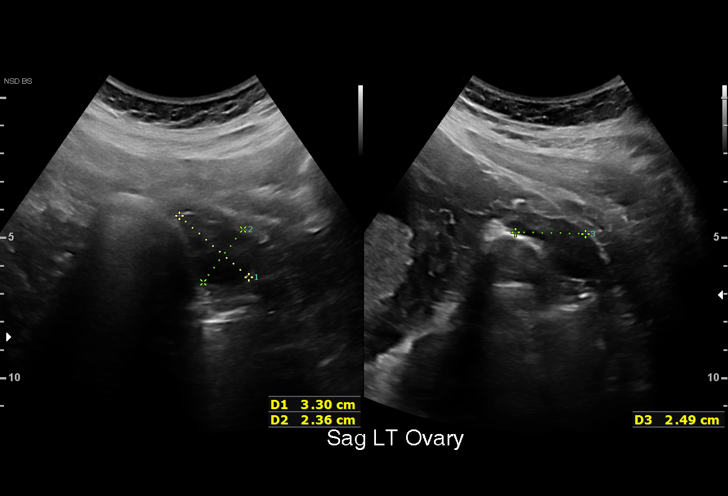

[15 of 17 positions shown; findings below may reference images not displayed]

DIEP

                                                             [facility] [HOSPITAL]
 Referred By:      BISHNU WIELAND          [facility]
                   [facility]                              [HOSPITAL]

 1  US MFM OB LIMITED                     76815.01    NIKIRK MO-NIQUE

Indications

 Vaginal bleeding in pregnancy, third trimester
 30 weeks gestation of pregnancy
Fetal Evaluation

 Num Of Fetuses:          1
 Fetal Heart Rate(bpm):   155
 Cardiac Activity:        Observed
 Presentation:            Cephalic
 Placenta:                Posterior
 P. Cord Insertion:       Not well visualized

 Amniotic Fluid
 AFI FV:      Within normal limits

 AFI Sum(cm)     %Tile       Largest Pocket(cm)
 13.2            40

 RUQ(cm)       RLQ(cm)       LUQ(cm)        LLQ(cm)


 Comment:    No placental abruption or previa identified.
Gestational Age

 Clinical EDD:  30w 4d                                        EDD:   02/21/22
 Best:          30w 4d     Det. By:  Clinical EDD             EDD:   02/21/22
Cervix Uterus Adnexa
 Cervix
 Not visualized (advanced GA >04wks)

 Right Ovary
 Within normal limits.

 Left Ovary
 Within normal limits.
Impression

 Limited exam due to maternal vaginal bleeding.
 Good fetal movement and amniotic fluid.
 No evidence of placenta abruption or previa.

Recommendations

 Clinical correlation recommended

## 2023-08-26 ENCOUNTER — Telehealth: Payer: Self-pay

## 2023-08-26 NOTE — Telephone Encounter (Signed)
Telephoned patient at mobile number. Left a voice message with [facility] contact information. Referral

## 2023-12-26 ENCOUNTER — Other Ambulatory Visit: Payer: Self-pay | Admitting: Obstetrics and Gynecology

## 2023-12-26 DIAGNOSIS — N6323 Unspecified lump in the left breast, lower outer quadrant: Secondary | ICD-10-CM

## 2023-12-27 ENCOUNTER — Other Ambulatory Visit: Payer: Self-pay | Admitting: Obstetrics and Gynecology

## 2023-12-27 ENCOUNTER — Ambulatory Visit
Admission: RE | Admit: 2023-12-27 | Discharge: 2023-12-27 | Disposition: A | Discharge status: Home / Self Care | Payer: Self-pay | Source: Ambulatory Visit | Attending: Obstetrics and Gynecology | Admitting: Obstetrics and Gynecology

## 2023-12-27 DIAGNOSIS — N6320 Unspecified lump in the left breast, unspecified quadrant: Secondary | ICD-10-CM

## 2023-12-27 DIAGNOSIS — N6323 Unspecified lump in the left breast, lower outer quadrant: Secondary | ICD-10-CM

## 2023-12-30 ENCOUNTER — Ambulatory Visit
Admission: RE | Admit: 2023-12-30 | Discharge: 2023-12-30 | Disposition: A | Discharge status: Home / Self Care | Source: Ambulatory Visit | Attending: Obstetrics and Gynecology | Admitting: Obstetrics and Gynecology

## 2023-12-30 DIAGNOSIS — N6320 Unspecified lump in the left breast, unspecified quadrant: Secondary | ICD-10-CM

## 2023-12-30 HISTORY — PX: BREAST BIOPSY: SHX20

## 2023-12-31 LAB — SURGICAL PATHOLOGY

## 2024-01-03 LAB — HEPATITIS C ANTIBODY: HCV Ab: NEGATIVE

## 2024-01-03 LAB — OB RESULTS CONSOLE RPR: RPR: NONREACTIVE

## 2024-01-03 LAB — OB RESULTS CONSOLE HIV ANTIBODY (ROUTINE TESTING): HIV: NONREACTIVE

## 2024-01-03 LAB — OB RESULTS CONSOLE RUBELLA ANTIBODY, IGM: Rubella: IMMUNE

## 2024-01-03 LAB — OB RESULTS CONSOLE ANTIBODY SCREEN: Antibody Screen: NEGATIVE

## 2024-01-03 LAB — OB RESULTS CONSOLE HEPATITIS B SURFACE ANTIGEN: Hepatitis B Surface Ag: NEGATIVE

## 2024-01-14 LAB — OB RESULTS CONSOLE GC/CHLAMYDIA
Chlamydia: NEGATIVE
Neisseria Gonorrhea: NEGATIVE

## 2024-07-17 ENCOUNTER — Encounter (HOSPITAL_COMMUNITY): Payer: Self-pay

## 2024-07-17 ENCOUNTER — Encounter (HOSPITAL_COMMUNITY): Payer: Self-pay | Admitting: *Deleted

## 2024-07-17 NOTE — Patient Instructions (Addendum)
 Joan Foster  07/17/2024   Your procedure is scheduled on:  08/03/2024  Arrive at 0530 at Entrance C on [address] at [facility]  and [employer]. You are invited to use the FREE valet parking or use the Visitor's parking deck.  Pick up the phone at the desk and dial [phone].  Call this number if you have problems the morning of surgery: [phone]  Remember:   Do not eat food:(After Midnight) Desps de medianoche.  You may drink clear liquids until  ___0330__.  Clear liquids means a liquid you can see thru.  It can have color such as Cola or Kool aid.  Tea is OK and coffee as long as no milk or creamer of any kind.  Take these medicines the morning of surgery with A SIP OF WATER:  wqellbutrin   Do not wear jewelry, make-up or nail polish.  Do not wear lotions, powders, or perfumes. Do not wear deodorant.  Do not shave 48 hours prior to surgery.  Do not bring valuables to the hospital.  [facility] is not   responsible for any belongings or valuables brought to the hospital.  Contacts, dentures or bridgework may not be worn into surgery.  Leave suitcase in the car. After surgery it may be brought to your room.  For patients admitted to the hospital, checkout time is 11:00 AM the day of              discharge.      Please read over the following fact sheets that you were given:     Preparing for Surgery

## 2024-07-20 ENCOUNTER — Inpatient Hospital Stay (HOSPITAL_COMMUNITY): Payer: Self-pay | Admitting: Anesthesiology

## 2024-07-20 ENCOUNTER — Inpatient Hospital Stay (HOSPITAL_COMMUNITY)
Admission: AD | Admit: 2024-07-20 | Discharge: 2024-07-22 | DRG: 788 | Disposition: A | Payer: Self-pay | Attending: Obstetrics and Gynecology | Admitting: Obstetrics and Gynecology

## 2024-07-20 ENCOUNTER — Encounter (HOSPITAL_COMMUNITY): Payer: Self-pay | Admitting: Obstetrics and Gynecology

## 2024-07-20 ENCOUNTER — Encounter (HOSPITAL_COMMUNITY): Admission: AD | Disposition: A | Payer: Self-pay | Source: Home / Self Care | Attending: Obstetrics and Gynecology

## 2024-07-20 DIAGNOSIS — O139 Gestational [pregnancy-induced] hypertension without significant proteinuria, unspecified trimester: Principal | ICD-10-CM | POA: Diagnosis present

## 2024-07-20 LAB — PROTEIN / CREATININE RATIO, URINE
Creatinine, Urine: 38 mg/dL
Protein Creatinine Ratio: 0.42 mg/mg{creat} — ABNORMAL HIGH (ref 0.00–0.15)
Total Protein, Urine: 16 mg/dL

## 2024-07-20 LAB — CBC
HCT: 35.9 % — ABNORMAL LOW (ref 36.0–46.0)
Hemoglobin: 11.4 g/dL — ABNORMAL LOW (ref 12.0–15.0)
MCH: 26.7 pg (ref 26.0–34.0)
MCHC: 31.8 g/dL (ref 30.0–36.0)
MCV: 84.1 fL (ref 80.0–100.0)
Platelets: 280 K/uL (ref 150–400)
RBC: 4.27 MIL/uL (ref 3.87–5.11)
RDW: 13.5 % (ref 11.5–15.5)
WBC: 7.5 K/uL (ref 4.0–10.5)
nRBC: 0.3 % — ABNORMAL HIGH (ref 0.0–0.2)

## 2024-07-20 LAB — COMPREHENSIVE METABOLIC PANEL WITH GFR
ALT: 22 U/L (ref 0–44)
AST: 53 U/L — ABNORMAL HIGH (ref 15–41)
Albumin: 2.6 g/dL — ABNORMAL LOW (ref 3.5–5.0)
Alkaline Phosphatase: 162 U/L — ABNORMAL HIGH (ref 38–126)
Anion gap: 8 (ref 5–15)
BUN: 6 mg/dL (ref 6–20)
CO2: 19 mmol/L — ABNORMAL LOW (ref 22–32)
Calcium: 8.3 mg/dL — ABNORMAL LOW (ref 8.9–10.3)
Chloride: 107 mmol/L (ref 98–111)
Creatinine, Ser: 0.54 mg/dL (ref 0.44–1.00)
GFR, Estimated: >60 mL/min (ref >60)
Glucose, Bld: 78 mg/dL (ref 70–99)
Potassium: 4.1 mmol/L (ref 3.5–5.1)
Sodium: 134 mmol/L — ABNORMAL LOW (ref 135–145)
Total Bilirubin: 0.7 mg/dL (ref 0.0–1.2)
Total Protein: 6.5 g/dL (ref 6.5–8.1)

## 2024-07-20 LAB — SYPHILIS: RPR W/REFLEX TO RPR TITER AND TREPONEMAL ANTIBODIES, TRADITIONAL SCREENING AND DIAGNOSIS ALGORITHM: RPR Ser Ql: NONREACTIVE

## 2024-07-20 LAB — TYPE AND SCREEN
ABO/RH(D): O POS
Antibody Screen: NEGATIVE

## 2024-07-20 SURGERY — Surgical Case
Anesthesia: Spinal | Site: Abdomen

## 2024-07-20 MED ORDER — TRANEXAMIC ACID-NACL 1000-0.7 MG/100ML-% IV SOLN
1000.0000 mg | Freq: Once | INTRAVENOUS | Status: AC
  Administered 2024-07-20: 1000 mg via INTRAVENOUS
  Filled 2024-07-20: qty 100

## 2024-07-20 MED ORDER — CEFAZOLIN SODIUM-DEXTROSE 2-4 GM/100ML-% IV SOLN: 2.0000 g | INTRAVENOUS | Status: DC

## 2024-07-20 MED ORDER — FENTANYL CITRATE (PF) 100 MCG/2ML IJ SOLN
INTRAMUSCULAR | Status: AC
  Filled 2024-07-20: qty 2

## 2024-07-20 MED ORDER — FAMOTIDINE 20 MG PO TABS: 20.0000 mg | ORAL_TABLET | Freq: Every evening | ORAL | Status: DC | PRN

## 2024-07-20 MED ORDER — KETOROLAC TROMETHAMINE 30 MG/ML IJ SOLN
INTRAMUSCULAR | Status: AC
  Filled 2024-07-20: qty 1

## 2024-07-20 MED ORDER — DIPHENHYDRAMINE HCL 25 MG PO CAPS
25.0000 mg | ORAL_CAPSULE | ORAL | Status: DC | PRN
  Filled 2024-07-20 (×2): qty 1

## 2024-07-20 MED ORDER — KETOROLAC TROMETHAMINE 30 MG/ML IJ SOLN
30.0000 mg | Freq: Once | INTRAMUSCULAR | Status: AC | PRN
  Administered 2024-07-20: 30 mg via INTRAVENOUS

## 2024-07-20 MED ORDER — HYDROMORPHONE HCL 1 MG/ML IJ SOLN: 0.2500 mg | INTRAMUSCULAR | Status: DC | PRN

## 2024-07-20 MED ORDER — MENTHOL 3 MG MT LOZG: 1.0000 | LOZENGE | OROMUCOSAL | Status: DC | PRN

## 2024-07-20 MED ORDER — PHENYLEPHRINE HCL-NACL 20-0.9 MG/250ML-% IV SOLN
INTRAVENOUS | Status: DC | PRN
  Administered 2024-07-20: 60 ug/min via INTRAVENOUS

## 2024-07-20 MED ORDER — MEPERIDINE HCL 25 MG/ML IJ SOLN: 6.2500 mg | INTRAMUSCULAR | Status: DC | PRN

## 2024-07-20 MED ORDER — SODIUM CHLORIDE 0.9 % IV SOLN
INTRAVENOUS | Status: AC
  Filled 2024-07-20: qty 5

## 2024-07-20 MED ORDER — ACETAMINOPHEN-CAFFEINE 500-65 MG PO TABS
2.0000 | ORAL_TABLET | Freq: Once | ORAL | Status: AC
  Administered 2024-07-20: 2 via ORAL
  Filled 2024-07-20: qty 2

## 2024-07-20 MED ORDER — CEFAZOLIN SODIUM-DEXTROSE 2-4 GM/100ML-% IV SOLN
INTRAVENOUS | Status: AC
  Filled 2024-07-20: qty 100

## 2024-07-20 MED ORDER — SOD CITRATE-CITRIC ACID 500-334 MG/5ML PO SOLN
30.0000 mL | ORAL | Status: AC
  Administered 2024-07-20: 30 mL via ORAL
  Filled 2024-07-20: qty 30

## 2024-07-20 MED ORDER — NALOXONE HCL 0.4 MG/ML IJ SOLN: 0.4000 mg | INTRAMUSCULAR | Status: DC | PRN

## 2024-07-20 MED ORDER — SIMETHICONE 80 MG PO CHEW
80.0000 mg | CHEWABLE_TABLET | ORAL | Status: DC | PRN
  Administered 2024-07-21: 80 mg via ORAL

## 2024-07-20 MED ORDER — LACTATED RINGERS IV SOLN: INTRAVENOUS | Status: AC

## 2024-07-20 MED ORDER — ZOLPIDEM TARTRATE 5 MG PO TABS: 5.0000 mg | ORAL_TABLET | Freq: Every evening | ORAL | Status: DC | PRN

## 2024-07-20 MED ORDER — MORPHINE SULFATE (PF) 0.5 MG/ML IJ SOLN
INTRAMUSCULAR | Status: AC
  Filled 2024-07-20: qty 10

## 2024-07-20 MED ORDER — FENTANYL CITRATE (PF) 100 MCG/2ML IJ SOLN: 25.0000 ug | INTRAMUSCULAR | Status: DC | PRN

## 2024-07-20 MED ORDER — PHENYLEPHRINE HCL-NACL 20-0.9 MG/250ML-% IV SOLN
INTRAVENOUS | Status: AC
  Filled 2024-07-20: qty 250

## 2024-07-20 MED ORDER — IBUPROFEN 600 MG PO TABS
600.0000 mg | ORAL_TABLET | Freq: Four times a day (QID) | ORAL | Status: DC
  Administered 2024-07-21 – 2024-07-22 (×3): 600 mg via ORAL
  Filled 2024-07-20 (×3): qty 1

## 2024-07-20 MED ORDER — STERILE WATER FOR IRRIGATION IR SOLN
Status: DC | PRN
  Administered 2024-07-20: 1

## 2024-07-20 MED ORDER — SIMETHICONE 80 MG PO CHEW
80.0000 mg | CHEWABLE_TABLET | Freq: Three times a day (TID) | ORAL | Status: DC
  Administered 2024-07-21 – 2024-07-22 (×3): 80 mg via ORAL
  Filled 2024-07-20 (×4): qty 1

## 2024-07-20 MED ORDER — DEXAMETHASONE SOD PHOSPHATE PF 10 MG/ML IJ SOLN
INTRAMUSCULAR | Status: DC | PRN
  Administered 2024-07-20: 10 mg via INTRAVENOUS

## 2024-07-20 MED ORDER — SENNOSIDES-DOCUSATE SODIUM 8.6-50 MG PO TABS
2.0000 | ORAL_TABLET | Freq: Every day | ORAL | Status: DC
  Administered 2024-07-21 – 2024-07-22 (×2): 2 via ORAL
  Filled 2024-07-20 (×2): qty 2

## 2024-07-20 MED ORDER — MORPHINE SULFATE (PF) 0.5 MG/ML IJ SOLN
INTRAMUSCULAR | Status: DC | PRN
  Administered 2024-07-20: 150 ug via INTRATHECAL

## 2024-07-20 MED ORDER — WITCH HAZEL-GLYCERIN EX PADS: 1.0000 | MEDICATED_PAD | CUTANEOUS | Status: DC | PRN

## 2024-07-20 MED ORDER — OXYCODONE HCL 5 MG/5ML PO SOLN: 5.0000 mg | Freq: Once | ORAL | Status: DC | PRN

## 2024-07-20 MED ORDER — NALOXONE HCL 4 MG/10ML IJ SOLN: 1.0000 ug/kg/h | INTRAVENOUS | Status: DC | PRN

## 2024-07-20 MED ORDER — DIPHENHYDRAMINE HCL 25 MG PO CAPS
25.0000 mg | ORAL_CAPSULE | Freq: Four times a day (QID) | ORAL | Status: DC | PRN
  Administered 2024-07-20 – 2024-07-21 (×2): 25 mg via ORAL

## 2024-07-20 MED ORDER — OXYTOCIN-SODIUM CHLORIDE 30-0.9 UT/500ML-% IV SOLN
INTRAVENOUS | Status: DC | PRN
  Administered 2024-07-20: 30 [IU] via INTRAVENOUS

## 2024-07-20 MED ORDER — OXYCODONE HCL 5 MG PO TABS: 5.0000 mg | ORAL_TABLET | Freq: Once | ORAL | Status: DC | PRN

## 2024-07-20 MED ORDER — BUPIVACAINE IN DEXTROSE 0.75-8.25 % IT SOLN
INTRATHECAL | Status: DC | PRN
  Administered 2024-07-20: 1.6 mL via INTRATHECAL

## 2024-07-20 MED ORDER — CEFAZOLIN SODIUM-DEXTROSE 2-4 GM/100ML-% IV SOLN
2.0000 g | INTRAVENOUS | Status: AC
  Administered 2024-07-20: 2 g via INTRAVENOUS
  Filled 2024-07-20: qty 100

## 2024-07-20 MED ORDER — SOD CITRATE-CITRIC ACID 500-334 MG/5ML PO SOLN: 30.0000 mL | ORAL | Status: DC

## 2024-07-20 MED ORDER — OXYCODONE HCL 5 MG PO TABS
5.0000 mg | ORAL_TABLET | ORAL | Status: DC | PRN
  Administered 2024-07-21 – 2024-07-22 (×5): 5 mg via ORAL
  Filled 2024-07-20 (×5): qty 1

## 2024-07-20 MED ORDER — COCONUT OIL OIL: 1.0000 | TOPICAL_OIL | Status: DC | PRN

## 2024-07-20 MED ORDER — KETOROLAC TROMETHAMINE 30 MG/ML IJ SOLN
30.0000 mg | Freq: Four times a day (QID) | INTRAMUSCULAR | Status: AC
  Administered 2024-07-21 (×3): 30 mg via INTRAVENOUS
  Filled 2024-07-20 (×3): qty 1

## 2024-07-20 MED ORDER — TRANEXAMIC ACID-NACL 1000-0.7 MG/100ML-% IV SOLN
INTRAVENOUS | Status: AC
  Filled 2024-07-20: qty 100

## 2024-07-20 MED ORDER — ONDANSETRON HCL 4 MG/2ML IJ SOLN
INTRAMUSCULAR | Status: DC | PRN
  Administered 2024-07-20: 4 mg via INTRAVENOUS

## 2024-07-20 MED ORDER — DIPHENHYDRAMINE HCL 50 MG/ML IJ SOLN: 12.5000 mg | INTRAMUSCULAR | Status: DC | PRN

## 2024-07-20 MED ORDER — FENTANYL CITRATE (PF) 100 MCG/2ML IJ SOLN
INTRAMUSCULAR | Status: DC | PRN
  Administered 2024-07-20: 15 ug via INTRATHECAL

## 2024-07-20 MED ORDER — ONDANSETRON HCL 4 MG/2ML IJ SOLN
INTRAMUSCULAR | Status: AC
  Filled 2024-07-20: qty 2

## 2024-07-20 MED ORDER — SODIUM CHLORIDE 0.9 % IR SOLN
Status: DC | PRN
  Administered 2024-07-20: 1

## 2024-07-20 MED ORDER — SODIUM CHLORIDE 0.9% FLUSH: 3.0000 mL | INTRAVENOUS | Status: DC | PRN

## 2024-07-20 MED ORDER — OXYTOCIN-SODIUM CHLORIDE 30-0.9 UT/500ML-% IV SOLN
2.5000 [IU]/h | INTRAVENOUS | Status: AC
  Administered 2024-07-20: 2.5 [IU]/h via INTRAVENOUS
  Filled 2024-07-20: qty 500

## 2024-07-20 MED ORDER — DIBUCAINE (PERIANAL) 1 % EX OINT: 1.0000 | TOPICAL_OINTMENT | CUTANEOUS | Status: DC | PRN

## 2024-07-20 MED ORDER — ACETAMINOPHEN 500 MG PO TABS
1000.0000 mg | ORAL_TABLET | Freq: Four times a day (QID) | ORAL | Status: DC
  Administered 2024-07-20 – 2024-07-22 (×6): 1000 mg via ORAL
  Filled 2024-07-20 (×7): qty 2

## 2024-07-20 MED ORDER — PRENATAL MULTIVITAMIN CH
1.0000 | ORAL_TABLET | Freq: Every day | ORAL | Status: DC
  Administered 2024-07-21: 1 via ORAL
  Filled 2024-07-20: qty 1

## 2024-07-20 SURGICAL SUPPLY — 32 items
BENZOIN TINCTURE PRP APPL 2/3 (GAUZE/BANDAGES/DRESSINGS) ×1 IMPLANT
CHLORAPREP W/TINT 26 (MISCELLANEOUS) ×2 IMPLANT
CLAMP UMBILICAL CORD (MISCELLANEOUS) ×1 IMPLANT
CLOTH BEACON ORANGE TIMEOUT ST (SAFETY) ×1 IMPLANT
DERMABOND ADVANCED .7 DNX12 (GAUZE/BANDAGES/DRESSINGS) IMPLANT
DRSG OPSITE POSTOP 4X10 (GAUZE/BANDAGES/DRESSINGS) ×1 IMPLANT
ELECTRODE REM PT RTRN 9FT ADLT (ELECTROSURGICAL) ×1 IMPLANT
EXTRACTOR VACUUM KIWI (MISCELLANEOUS) IMPLANT
GAUZE PAD ABD 7.5X8 STRL (GAUZE/BANDAGES/DRESSINGS) IMPLANT
GAUZE SPONGE 4X4 12PLY STRL LF (GAUZE/BANDAGES/DRESSINGS) IMPLANT
GLOVE BIOGEL PI IND STRL 6 (GLOVE) ×1 IMPLANT
GLOVE SS PI 5.5 STRL (GLOVE) ×1 IMPLANT
GOWN STRL REUS W/TWL LRG LVL3 (GOWN DISPOSABLE) ×2 IMPLANT
KIT ABG SYR 3ML LUER SLIP (SYRINGE) ×1 IMPLANT
MAT PREVALON FULL STRYKER (MISCELLANEOUS) IMPLANT
NDL HYPO 25X5/8 SAFETYGLIDE (NEEDLE) ×1 IMPLANT
NEEDLE HYPO 25X5/8 SAFETYGLIDE (NEEDLE) ×1 IMPLANT
NS IRRIG 1000ML POUR BTL (IV SOLUTION) ×1 IMPLANT
PACK C SECTION WH (CUSTOM PROCEDURE TRAY) ×1 IMPLANT
PAD OB MATERNITY 4.3X12.25 (PERSONAL CARE ITEMS) ×1 IMPLANT
RTRCTR C-SECT PINK 25CM LRG (MISCELLANEOUS) ×1 IMPLANT
STRIP CLOSURE SKIN 1/2X4 (GAUZE/BANDAGES/DRESSINGS) IMPLANT
SUT CHROMIC 2 0 CT 1 (SUTURE) ×1 IMPLANT
SUT MON AB 2-0 CT1 27 (SUTURE) ×1 IMPLANT
SUT MON AB-0 CT1 36 (SUTURE) ×1 IMPLANT
SUT PDS AB 0 CT1 27 (SUTURE) ×1 IMPLANT
SUT VIC AB 0 CTX36XBRD ANBCTRL (SUTURE) ×2 IMPLANT
SUT VIC AB 4-0 PS2 27 (SUTURE) ×1 IMPLANT
TAPE CLOTH SURG 4X10 WHT LF (GAUZE/BANDAGES/DRESSINGS) IMPLANT
TOWEL OR 17X24 6PK STRL BLUE (TOWEL DISPOSABLE) ×1 IMPLANT
TRAY FOLEY W/BAG SLVR 14FR LF (SET/KITS/TRAYS/PACK) IMPLANT
WATER STERILE IRR 1000ML POUR (IV SOLUTION) ×1 IMPLANT

## 2024-07-20 NOTE — Transfer of Care (Signed)
 Immediate Anesthesia Transfer of Care Note  Patient: Joan Foster  Procedure(s) Performed: CESAREAN DELIVERY (Abdomen)  Patient Location: PACU  Anesthesia Type:Spinal  Level of Consciousness: awake, alert , and oriented  Airway & Oxygen Therapy: Patient Spontanous Breathing  Post-op Assessment: Report given to RN and Post -op Vital signs reviewed and stable  Post vital signs: Reviewed and stable  Last Vitals:  Vitals Value Taken Time  BP 106/89 07/20/24 20:15  Temp 36.7 C 07/20/24 20:14  Pulse 94 07/20/24 20:16  Resp 14 07/20/24 20:16  SpO2 99 % 07/20/24 20:16  Vitals shown include unfiled device data.  Last Pain:  Vitals:   07/20/24 2014  TempSrc: Oral  PainSc:          Complications: No notable events documented.

## 2024-07-20 NOTE — MAU Provider Note (Addendum)
 History     CSN: [id number]  Arrival date and time: 07/20/24 1108   Event Date/Time   First Provider Initiated Contact with Patient 07/20/24 1204      Chief Complaint  Patient presents with   Hypertension   Headache   HPI Joan Foster [id number] at [redacted]w[redacted]d who was sent to the MAU from [facility] office due to elevated blood pressure. Complaining of mild HA. No RUQ pain, no swelling.  Has a history of gHTN in prior pregnancy. She has had CS previously and plans for repeat. She was NPO in the office but then ate cookies on her way to the MAU.   OB History     Gravida  2   Para  2   Term  2   Preterm      AB      Living  2      SAB      IAB      Ectopic      Multiple  0   Live Births  2           Past Medical History:  Diagnosis Date   Depression    Pregnancy induced hypertension     Past Surgical History:  Procedure Laterality Date   BREAST BIOPSY Left 12/30/2023   US  LT BREAST BX W LOC DEV 1ST LESION IMG BX SPEC US  GUIDE 12/30/2023 [facility]MOGRAPHY   CESAREAN SECTION N/A 02/22/2022   Procedure: CESAREAN SECTION;  Surgeon: Dannielle Bouchard, DO;  Location: [facility] LD ORS;  Service: Obstetrics;  Laterality: N/A;   NO PAST SURGERIES      History reviewed. No pertinent family history.  Social History   Tobacco Use   Smoking status: Never   Smokeless tobacco: Never  Vaping Use   Vaping status: Never Used  Substance Use Topics   Alcohol use: Never   Drug use: Never    Allergies: No Known Allergies  Medications Prior to Admission  Medication Sig Dispense Refill Last Dose/Taking   buPROPion  (WELLBUTRIN  XL) 150 MG 24 hr tablet Take 150 mg by mouth daily.   07/20/2024   Prenatal Vit-Fe Fumarate-FA (MULTIVITAMIN-PRENATAL) 27-0.8 MG TABS tablet Take 1 tablet by mouth daily at 12 noon.   07/20/2024   ibuprofen  (ADVIL ) 600 MG tablet Take 1 tablet (600 mg total) by mouth every 6 (six) hours as needed for mild pain, moderate pain or cramping. 30 tablet 0     oxyCODONE  (OXY IR/ROXICODONE ) 5 MG immediate release tablet Take 1 tablet (5 mg total) by mouth every 6 (six) hours as needed for severe pain. 10 tablet 0     Review of Systems  Constitutional:  Negative for chills and fever.  HENT:  Negative for congestion and sore throat.   Eyes:  Negative for pain and visual disturbance.  Respiratory:  Negative for cough, chest tightness and shortness of breath.   Cardiovascular:  Negative for chest pain.  Gastrointestinal:  Negative for abdominal pain, diarrhea, nausea and vomiting.  Endocrine: Negative for cold intolerance and heat intolerance.  Genitourinary:  Negative for dysuria and flank pain.  Musculoskeletal:  Negative for back pain.  Skin:  Negative for rash.  Allergic/Immunologic: Negative for food allergies.  Neurological:  Negative for dizziness and light-headedness.  Psychiatric/Behavioral:  Negative for agitation.    Physical Exam   Blood pressure (!) 128/92, pulse 93, temperature 98.4 F (36.9 C), temperature source Oral, resp. rate 16, height 5' 4 (1.626 m), weight 79.4 kg, SpO2 100%, unknown if  currently breastfeeding.  Repeat BPS were 133/93 and 122/91  Physical Exam Vitals and nursing note reviewed.  Constitutional:      Appearance: Normal appearance.  HENT:     Head: Normocephalic and atraumatic.     Nose: Nose normal.     Mouth/Throat:     Mouth: Mucous membranes are moist.  Eyes:     Conjunctiva/sclera: Conjunctivae normal.  Cardiovascular:     Rate and Rhythm: Normal rate.  Pulmonary:     Effort: Pulmonary effort is normal.  Abdominal:     General: Abdomen is flat.     Palpations: Abdomen is soft.  Musculoskeletal:     Cervical back: Normal range of motion.  Skin:    General: Skin is warm.     Capillary Refill: Capillary refill takes less than 2 seconds.  Neurological:     General: No focal deficit present.     Mental Status: She is alert.  Psychiatric:        Mood and Affect: Mood normal.     MAU  Course  Procedures NST in MAU - 150/moderate/+accels, no decels and Reassuring and reactive  Ddx includes gHTN, PEC vs normal variant. Most likely gHTN.  Ordered PEC labs - CBC WNL - CMP WNL - UPC was elevated at 0.42 Meets criteria by BP and proteinuria for mild preeclampsia, no need for magnesium now Given Exedrin tension at 11:58   MDM- high  Assessment and Plan   Preeclampsia Mild - Admit to hospital - coordinated care with Dr. Laurence.  - Will have repeat CS with Dr. Laurence at 9 PM based on PO status  Joan Foster 07/20/2024, 12:04 PM

## 2024-07-20 NOTE — Lactation Note (Signed)
 This note was copied from a baby's chart. Lactation Consultation Note  Patient Name: Joan Foster Unijb'd Date: 07/20/2024 Age:28 years Reason for consult: Initial assessment  MOB plans to formula feed only. Please let LC team know if she is in need of assistance.  Feeding Mother's Current Feeding Choice: Formula  Consult Status Consult Status: Complete    Recardo Hoit BS, IBCLC 07/20/2024, 8:38 PM

## 2024-07-20 NOTE — Anesthesia Preprocedure Evaluation (Addendum)
 Anesthesia Evaluation  Patient identified by MRN, date of birth, ID band Patient awake    Reviewed: Allergy & Precautions, Patient's Chart, lab work & pertinent test results  Airway Mallampati: II  TM Distance: >3 FB Neck ROM: Full    Dental no notable dental hx.    Pulmonary neg pulmonary ROS   Pulmonary exam normal breath sounds clear to auscultation       Cardiovascular hypertension, Normal cardiovascular exam Rhythm:Regular Rate:Normal     Neuro/Psych  PSYCHIATRIC DISORDERS  Depression    negative neurological ROS     GI/Hepatic Neg liver ROS,GERD  ,,  Endo/Other  Obesity  Renal/GU negative Renal ROS  negative genitourinary   Musculoskeletal negative musculoskeletal ROS (+)    Abdominal  (+) + obese  Peds  Hematology  (+) Blood dyscrasia, anemia   Anesthesia Other Findings   Reproductive/Obstetrics (+) Pregnancy                              Anesthesia Physical Anesthesia Plan  ASA: 2  Anesthesia Plan: Spinal   Post-op Pain Management:    Induction:   PONV Risk Score and Plan: Treatment may vary due to age or medical condition  Airway Management Planned: Natural Airway  Additional Equipment:   Intra-op Plan:   Post-operative Plan:   Informed Consent: I have reviewed the patients History and Physical, chart, labs and discussed the procedure including the risks, benefits and alternatives for the proposed anesthesia with the patient or authorized representative who has indicated his/her understanding and acceptance.       Plan Discussed with: Anesthesiologist  Anesthesia Plan Comments:          Anesthesia Quick Evaluation

## 2024-07-20 NOTE — Op Note (Signed)
 PROCEDURE DATE: 07/20/2024   PREOPERATIVE DIAGNOSIS: [redacted]w[redacted]d gestation of pregnancy, prior cesarean section x1   POSTOPERATIVE DIAGNOSIS: The same   PROCEDURE: Repeat Low Transverse Cesarean Section   SURGEON:  Dr. Slater Door   INDICATIONS: This is a 28yo G2P1001 at [redacted]w[redacted]d requiring cesarean section secondary to gestational hypertension.   Decision made to proceed with LTCS. The risks of cesarean section discussed with the patient included but were not limited to: bleeding which may require transfusion or reoperation; infection which may require antibiotics; injury to bowel, bladder, ureters or other surrounding organs; injury to the fetus; need for additional procedures including hysterectomy in the event of a life-threatening hemorrhage; placental abnormalities wth subsequent pregnancies, incisional problems, thromboembolic phenomenon and other postoperative/anesthesia complications. The patient agreed with the proposed plan, giving informed consent for the procedure.     FINDINGS:  Viable female infant in vertex presentation, APGARs 9+9,  Weight pending, Amniotic fluid clear,  Intact placenta, three vessel cord.  Grossly normal uterus. Minimal intraabdominal adhesions. .   ANESTHESIA: Spinal ESTIMATED BLOOD LOSS: 285ccs EBL, QBL pending SPECIMENS: Placenta for routine COMPLICATIONS: None immediate    PROCEDURE IN DETAIL:  The patient received intravenous antibiotics and had sequential compression devices applied to her lower extremities while in the preoperative area.  She was then taken to the operating room where spinal anesthesia obtained. She was then placed in a dorsal supine position with a leftward tilt, and prepped and draped in a sterile manner.  A foley catheter was placed into her bladder and attached to constant gravity.  After an adequate timeout was performed, a Pfannenstiel skin incision was made with scalpel and carried through to the underlying layer of fascia. The fascia was  incised in the midline and this incision was extended bilaterally with curved Mayos. Kocher clamps were applied to the superior aspect of the fascial incision and the underlying rectus muscles were dissected off bluntly and with curved Mayos. A similar process was carried out on the inferior aspect of the facial incision. The rectus muscles were separated in the midline bluntly and the peritoneum was entered bluntly.  The peritoneum was extended bilaterally and an Alexis retractor was placed for better visualization. A bladder flap was created sharply and developed bluntly. A transverse hysterotomy was made with a scalpel and extended bilaterally bluntly. The infant was successfully delivered, and cord was clamped and cut and infant was handed over to awaiting neonatology team. Cord blood was collected. Uterine massage was then administered and the placenta delivered intact with three-vessel cord. The uterus was cleared of clot and debris.  The hysterotomy was closed with 0 vicryl. The peritoneal cavity was inspected and excellent hemostasis was noted. The Alexis retractor was removed. The fascia was closed with 0-PDS in a running fashion with good restoration of anatomy.  The subcutaneus tissue was irrigated and was reapproximated using 2-0 monocryl.  The skin was closed with 4-0 Vicryl in a subcuticular fashion.  All surgical sites examined and hemostatic at end of procedure.   Pt tolerated the procedure well. All sponge/lap/needle counts were correct  X 2. Pt taken to recovery room in stable condition.     Slater Door, MD

## 2024-07-20 NOTE — Anesthesia Procedure Notes (Signed)
 Spinal  Patient location during procedure: OR Start time: 07/20/2024 6:46 PM End time: 07/20/2024 6:50 PM Reason for block: surgical anesthesia Staffing Performed: anesthesiologist  Anesthesiologist: Peggye Delon Brunswick, MD Performed by: Peggye Delon Brunswick, MD Authorized by: Peggye Delon Brunswick, MD   Preanesthetic Checklist Completed: patient identified, IV checked, site marked, risks and benefits discussed, surgical consent, monitors and equipment checked, pre-op evaluation and timeout performed Spinal Block Patient position: sitting Prep: DuraPrep Patient monitoring: blood pressure and continuous pulse ox Approach: midline Location: L3-4 Injection technique: single-shot Needle Needle type: Pencan  Needle gauge: 24 G Needle length: 9 cm Assessment Sensory level: T4 Additional Notes Risks and benefits of neuraxial anesthesia including, but not limited to, infection, bleeding, local anesthetic toxicity, headache, hypotension, back pain, block failure, etc. were discussed with the patient. The patient expressed understanding and consented to the procedure. I confirmed that the patient has no bleeding disorders and is not taking blood thinners. I confirmed the patient's last platelet count with the nurse. Monitors were applied. A time-out was performed immediately prior to the procedure. Sterile technique was used throughout the whole procedure.   1 attempt(s)

## 2024-07-20 NOTE — H&P (Signed)
 Joan Foster is a 28 y.o. female presenting from the office for delivery due to persistent mild range Bps and mild headache - in MAU, her headache was improved.  OB History     Gravida  2   Para  1   Term  1   Preterm      AB      Living  1      SAB      IAB      Ectopic      Multiple  0   Live Births  1          Past Medical History:  Diagnosis Date   Depression    Pregnancy induced hypertension    Past Surgical History:  Procedure Laterality Date   BREAST BIOPSY Left 12/30/2023   US  LT BREAST BX W LOC DEV 1ST LESION IMG BX SPEC US  GUIDE 12/30/2023 [facility]   CESAREAN SECTION N/A 02/22/2022   Procedure: CESAREAN SECTION;  Surgeon: Dannielle Bouchard, DO;  Location: [facility] LD ORS;  Service: Obstetrics;  Laterality: N/A;   NO PAST SURGERIES     Family History: family history is not on file. Social History:  reports that she has never smoked. She has never used smokeless tobacco. She reports that she does not drink alcohol and does not use drugs.     Maternal Diabetes: No Genetic Screening: Normal Maternal Ultrasounds/Referrals: Normal Fetal Ultrasounds or other Referrals:  None Maternal Substance Abuse:  No Significant Maternal Medications:  None Significant Maternal Lab Results:  Other: GBS unknown Number of Prenatal Visits:greater than 3 verified prenatal visits      07/20/2024    1:16 PM 07/20/2024    1:01 PM 07/20/2024   12:46 PM  Vitals with BMI  Systolic 126 133 877  Diastolic 78 93 91  Pulse 94 96 96    Blood pressure 126/78, pulse 94, temperature 98.4 F (36.9 C), temperature source Oral, resp. rate 16, height 5' 4 (1.626 m), weight 79.4 kg, SpO2 100%, unknown if currently breastfeeding.  Constitutional:      Appearance: Normal appearance.  HENT:     Head: Normocephalic.  Cardiovascular:     Rate and Rhythm: Normal rate.    Pulses: Normal pulses.  Abdominal:     General: Abdomen is Gravid, nontender Neurological:      Mental Status: She is alert.   Prenatal labs: ABO, Rh: --/--/O POS (12/08 1150) Antibody: NEG (12/08 1150) Rubella: Immune (05/23 0000) RPR: Nonreactive (05/23 0000)  HBsAg: Negative (05/23 0000)  HIV: Non-reactive (05/23 0000)  GBS:   unknown  Assessment/Plan: 28yo G2P1001 at [redacted]w[redacted]d who is admitted for delivery due to gHTN. Labs all wnl. NPO time 0900, cookies - will post for 5pm when NPO time is up. Prior CS x1, for repeat. Risks discussed including infection, bleeding, damage to surrounding structures, the need for additional procedures including hysterectomy, and the possibility of uterine rupture with neonatal morbidity/mortality, scarring, and abnormal placentation with subsequent pregnancies. Patient agrees to proceed.      Slater JINNY Door 07/20/2024, 1:17 PM

## 2024-07-20 NOTE — MAU Note (Signed)
 Joan Foster is a 28 y.o. at [redacted]w[redacted]d here in MAU reporting: she was sent from the office due to elevated b/p and will have a repeat c/s today. Reports headache and blurred vision off and on x one week. Reports positive fetal movement.   Onset of complaint: x one week  Pain score: 7/10 headache There were no vitals filed for this visit.   FHT: na  Lab orders placed from triage: bloodwork, urine

## 2024-07-20 NOTE — Anesthesia Postprocedure Evaluation (Signed)
 Anesthesia Post Note  Patient: Joan Foster  Procedure(s) Performed: CESAREAN DELIVERY (Abdomen)     Patient location during evaluation: PACU Anesthesia Type: Spinal Level of consciousness: awake Pain management: pain level controlled Vital Signs Assessment: post-procedure vital signs reviewed and stable Respiratory status: spontaneous breathing, respiratory function stable and nonlabored ventilation Cardiovascular status: blood pressure returned to baseline and stable Postop Assessment: no headache, no backache and no apparent nausea or vomiting Anesthetic complications: no   No notable events documented.  Last Vitals:  Vitals:   07/20/24 2043 07/20/24 2045  BP:  97/82  Pulse: 90 86  Resp: 16 14  Temp: 36.8 C   SpO2: 100% 98%    Last Pain:  Vitals:   07/20/24 2043  TempSrc: Axillary  PainSc:                  Delon Aisha Arch

## 2024-07-21 ENCOUNTER — Encounter (HOSPITAL_COMMUNITY): Payer: Self-pay | Admitting: Obstetrics and Gynecology

## 2024-07-21 LAB — CBC
HCT: 33.2 % — ABNORMAL LOW (ref 36.0–46.0)
Hemoglobin: 10.7 g/dL — ABNORMAL LOW (ref 12.0–15.0)
MCH: 26.6 pg (ref 26.0–34.0)
MCHC: 32.2 g/dL (ref 30.0–36.0)
MCV: 82.4 fL (ref 80.0–100.0)
Platelets: 262 K/uL (ref 150–400)
RBC: 4.03 MIL/uL (ref 3.87–5.11)
RDW: 13.2 % (ref 11.5–15.5)
WBC: 10.9 K/uL — ABNORMAL HIGH (ref 4.0–10.5)
nRBC: 0 % (ref 0.0–0.2)

## 2024-07-21 NOTE — Social Work (Signed)
 MOB was referred for history of depression/anxiety.  * Referral screened out by Clinical Social Worker because none of the following criteria appear to apply:  ~ History of anxiety/depression during this pregnancy, or of post-partum depression following prior delivery.  ~ Diagnosis of anxiety and/or depression within last 3 years OR * MOB's symptoms currently being treated with medication and/or therapy. Per OB records MOB has an active prescription for Wellbutrin  Edinburgh=7   Please contact the Clinical Social Worker if needs arise, or by MOB request.   Eliazar Gave, LCSWA Clinical Social Worker [phone]

## 2024-07-21 NOTE — Progress Notes (Signed)
 Subjective: Postpartum Day 1: Cesarean Delivery Patient reports tolerating PO, + flatus, and no problems voiding.  No HA, vision change, RUQ pain, CP/SOB.    Objective: Vital signs in last 24 hours: Temp:  [97.9 F (36.6 C)-98.9 F (37.2 C)] 98.6 F (37 C) (12/09 0819) Pulse Rate:  [65-99] 65 (12/09 0819) Resp:  [14-21] 16 (12/09 0819) BP: (97-145)/(72-119) 114/74 (12/09 0819) SpO2:  [95 %-100 %] 99 % (12/09 0819) Weight:  [79.4 kg] 79.4 kg (12/08 1138)  Physical Exam:  General: alert, cooperative, and appears stated age 13: appropriate Uterine Fundus: firm Incision: healing well, no significant drainage, no dehiscence, pressure dressing removed DVT Evaluation: No evidence of DVT seen on physical exam. Negative Homan's sign. No cords or calf tenderness.  Recent Labs    07/20/24 1150 07/21/24 0454  HGB 11.4* 10.7*  HCT 35.9* 33.2*    Assessment/Plan: Status post Cesarean section. Doing well postoperatively.  GHTN-normal BPs and asymptomatic Continue current care.  Duwaine Blumenthal, DO 07/21/2024, 9:54 AM

## 2024-07-22 DIAGNOSIS — Z98891 History of uterine scar from previous surgery: Secondary | ICD-10-CM

## 2024-07-22 DIAGNOSIS — O1403 Mild to moderate pre-eclampsia, third trimester: Secondary | ICD-10-CM

## 2024-07-22 DIAGNOSIS — Z3A37 37 weeks gestation of pregnancy: Secondary | ICD-10-CM

## 2024-07-22 MED ORDER — IBUPROFEN 600 MG PO TABS: 600.0000 mg | ORAL_TABLET | Freq: Four times a day (QID) | ORAL | 0 refills | Status: AC | PRN

## 2024-07-22 MED ORDER — OXYCODONE HCL 5 MG PO TABS: 5.0000 mg | ORAL_TABLET | ORAL | 0 refills | Status: AC | PRN

## 2024-07-22 NOTE — Progress Notes (Signed)
 Patient stated when in room last time that her right hand is tight/swollen and numb.  Said that she had trouble making a tight enough fist for the CBC blood draw this am.  Patient states no headache, vision disturbances or edema in face or left hand or either arm.  Went over signs/symptoms for preeclampsia.  BP is WNL.

## 2024-07-22 NOTE — Discharge Summary (Signed)
 Postpartum Discharge Summary      Patient Name: Joan Foster DOB: Feb 12, 1996 MRN: [id number]  Date of admission: 07/20/2024 Delivery date:07/20/2024 Delivering provider: LAURENCE SLATER PARAS Date of discharge: 07/22/2024  Admitting diagnosis: Gestational hypertension [O13.9] Intrauterine pregnancy: [redacted]w[redacted]d     Secondary diagnosis:  Principal Problem:   Gestational hypertension  Additional problems:      Discharge diagnosis: Term Pregnancy Delivered                                              Post partum procedures:  Augmentation: N/A Complications: None  Hospital course: Sceduled C/S   28 y.o. yo G2P2002 at [redacted]w[redacted]d was admitted to the hospital 07/20/2024 for scheduled cesarean section with the following indication:Prior Uterine Surgery.Delivery details are as follows:  Membrane Rupture Time/Date: 7:19 PM,07/20/2024  Delivery Method:C-Section, Low Transverse Operative Delivery:N/A Details of operation can be found in separate operative note.  Patient had a postpartum course complicated by none.  She is ambulating, tolerating a regular diet, passing flatus, and urinating well. Patient is discharged home in stable condition on  07/22/24        Newborn Data: Birth date:07/20/2024 Birth time:7:20 PM Gender:Female Living status:Living Apgars:9 ,9  Weight:3459 g    Magnesium Sulfate received: No BMZ received: No Rhophylac:N/A MMR:N/A T-DaP:Given prenatally Flu: Yes RSV Vaccine received: No Transfusion:No Immunizations administered: Immunization History  Administered Date(s) Administered   Moderna Sars-Covid-2 Vaccination 03/10/2020, 04/05/2020    Physical exam  Vitals:   07/21/24 0819 07/21/24 1201 07/21/24 2030 07/22/24 0515  BP: 114/74 114/79 119/81 115/80  Pulse: 65 84 78 66  Resp: 16 18 18 18   Temp: 98.6 F (37 C) 98.6 F (37 C) 97.6 F (36.4 C) 98.2 F (36.8 C)  TempSrc: Oral Oral Oral Oral  SpO2: 99% 99% 99% 99%  Weight:      Height:       General:  alert, cooperative, and no distress Lochia: appropriate Uterine Fundus: firm Incision: Healing well with no significant drainage DVT Evaluation: No evidence of DVT seen on physical exam. Labs: Lab Results  Component Value Date   WBC 10.9 (H) 07/21/2024   HGB 10.7 (L) 07/21/2024   HCT 33.2 (L) 07/21/2024   MCV 82.4 07/21/2024   PLT 262 07/21/2024      Latest Ref Rng & Units 07/20/2024   11:50 AM  CMP  Glucose 70 - 99 mg/dL 78   BUN 6 - 20 mg/dL 6   Creatinine 9.55 - 8.99 mg/dL 9.45   Sodium 864 - 854 mmol/L 134   Potassium 3.5 - 5.1 mmol/L 4.1   Chloride 98 - 111 mmol/L 107   CO2 22 - 32 mmol/L 19   Calcium 8.9 - 10.3 mg/dL 8.3   Total Protein 6.5 - 8.1 g/dL 6.5   Total Bilirubin 0.0 - 1.2 mg/dL 0.7   Alkaline Phos 38 - 126 U/L 162   AST 15 - 41 U/L 53   ALT 0 - 44 U/L 22    Edinburgh Score:    07/21/2024   12:19 AM  Edinburgh Postnatal Depression Scale Screening Tool  I have been able to laugh and see the funny side of things. 0  I have looked forward with enjoyment to things. 0  I have blamed myself unnecessarily when things went wrong. 2  I have been anxious or worried for no  good reason. 1  I have felt scared or panicky for no good reason. 0  Things have been getting on top of me. 1  I have been so unhappy that I have had difficulty sleeping. 1  I have felt sad or miserable. 1  I have been so unhappy that I have been crying. 1  The thought of harming myself has occurred to me. 0  Edinburgh Postnatal Depression Scale Total 7      After visit meds:  Allergies as of 07/22/2024   No Known Allergies      Medication List     TAKE these medications    buPROPion  150 MG 24 hr tablet Commonly known as: WELLBUTRIN  XL Take 150 mg by mouth daily.   ibuprofen  600 MG tablet Commonly known as: ADVIL  Take 1 tablet (600 mg total) by mouth every 6 (six) hours as needed for mild pain (pain score 1-3), moderate pain (pain score 4-6) or cramping.    multivitamin-prenatal 27-0.8 MG Tabs tablet Take 1 tablet by mouth daily at 12 noon.   oxyCODONE  5 MG immediate release tablet Commonly known as: Oxy IR/ROXICODONE  Take 1-2 tablets (5-10 mg total) by mouth every 4 (four) hours as needed for moderate pain (pain score 4-6). What changed:  how much to take when to take this reasons to take this         Discharge home in stable condition Infant Feeding: Breast Infant Disposition:home with mother Discharge instruction: per After Visit Summary and Postpartum booklet. Activity: Advance as tolerated. Pelvic rest for 6 weeks.  Diet: routine diet Anticipated Birth Control: Unsure Postpartum Appointment:6 weeks Additional Postpartum F/U:   Future Appointments:No future appointments. Follow up Visit:      07/22/2024 Alm Joan Cook, MD

## 2024-07-23 LAB — BIRTH TISSUE RECOVERY COLLECTION (PLACENTA DONATION)

## 2024-07-28 ENCOUNTER — Telehealth (HOSPITAL_COMMUNITY): Payer: Self-pay

## 2024-07-28 DIAGNOSIS — O1403 Mild to moderate pre-eclampsia, third trimester: Secondary | ICD-10-CM

## 2024-07-28 DIAGNOSIS — Z98891 History of uterine scar from previous surgery: Secondary | ICD-10-CM

## 2024-07-28 NOTE — Telephone Encounter (Signed)
 07/28/2024 1222  Name: Joan Foster MRN: [id number] DOB: May 18, 1996  Reason for Call:  Transition of Care Hospital Discharge Call  Contact Status: Patient Contact Status: Message  Language assistant needed:          Follow-Up Questions:    Van Postnatal Depression Scale:  In the Past 7 Days:    PHQ2-9 Depression Scale:     Discharge Follow-up:    Post-discharge interventions: NA  Signature  Rosaline Deretha PEAK

## 2024-07-31 ENCOUNTER — Encounter (HOSPITAL_COMMUNITY)
Admission: RE | Admit: 2024-07-31 | Discharge: 2024-07-31 | Disposition: A | Discharge status: Home / Self Care | Payer: Self-pay | Source: Ambulatory Visit | Attending: Obstetrics & Gynecology | Admitting: Obstetrics & Gynecology

## 2024-07-31 HISTORY — DX: Gestational (pregnancy-induced) hypertension without significant proteinuria, unspecified trimester: O13.9

## 2024-08-03 ENCOUNTER — Encounter (HOSPITAL_COMMUNITY): Admission: RE | Payer: Self-pay | Source: Home / Self Care

## 2024-08-03 ENCOUNTER — Inpatient Hospital Stay (HOSPITAL_COMMUNITY): Admission: RE | Admit: 2024-08-03 | Source: Home / Self Care | Admitting: Obstetrics & Gynecology

## 2024-08-03 SURGERY — Surgical Case
Anesthesia: Regional
